# Patient Record
Sex: Female | Born: 1975 | Hispanic: No | Marital: Married | Smoking: Current some day smoker
Health system: Southern US, Community
[De-identification: ages and names within clinical notes are randomized; demographics above are authoritative.]

## PROBLEM LIST (undated history)

## (undated) DIAGNOSIS — M79672 Pain in left foot: Secondary | ICD-10-CM

## (undated) DIAGNOSIS — F419 Anxiety disorder, unspecified: Secondary | ICD-10-CM

## (undated) DIAGNOSIS — N289 Disorder of kidney and ureter, unspecified: Secondary | ICD-10-CM

## (undated) DIAGNOSIS — K8020 Calculus of gallbladder without cholecystitis without obstruction: Secondary | ICD-10-CM

## (undated) DIAGNOSIS — D649 Anemia, unspecified: Secondary | ICD-10-CM

## (undated) DIAGNOSIS — R002 Palpitations: Secondary | ICD-10-CM

## (undated) HISTORY — DX: Anemia, unspecified: D64.9

## (undated) HISTORY — DX: Pain in left foot: M79.672

## (undated) HISTORY — PX: ABLATION: SHX5711

## (undated) HISTORY — PX: TUBAL LIGATION: SHX77

## (undated) HISTORY — DX: Palpitations: R00.2

## (undated) HISTORY — DX: Calculus of gallbladder without cholecystitis without obstruction: K80.20

## (undated) HISTORY — PX: TONSILLECTOMY: SUR1361

---

## 2002-12-30 ENCOUNTER — Encounter: Payer: Self-pay | Admitting: Family Medicine

## 2002-12-30 ENCOUNTER — Ambulatory Visit (HOSPITAL_COMMUNITY): Admission: RE | Admit: 2002-12-30 | Discharge: 2002-12-30 | Payer: Self-pay | Admitting: Family Medicine

## 2003-02-03 ENCOUNTER — Other Ambulatory Visit: Admission: RE | Admit: 2003-02-03 | Discharge: 2003-02-03 | Payer: Self-pay | Admitting: Family Medicine

## 2003-03-07 ENCOUNTER — Encounter: Payer: Self-pay | Admitting: Family Medicine

## 2003-03-07 ENCOUNTER — Ambulatory Visit (HOSPITAL_COMMUNITY): Admission: RE | Admit: 2003-03-07 | Discharge: 2003-03-07 | Payer: Self-pay | Admitting: Family Medicine

## 2003-04-06 ENCOUNTER — Ambulatory Visit (HOSPITAL_COMMUNITY): Admission: RE | Admit: 2003-04-06 | Discharge: 2003-04-06 | Payer: Self-pay | Admitting: Family Medicine

## 2003-04-06 ENCOUNTER — Encounter: Payer: Self-pay | Admitting: Family Medicine

## 2003-07-19 ENCOUNTER — Ambulatory Visit (HOSPITAL_COMMUNITY): Admission: RE | Admit: 2003-07-19 | Discharge: 2003-07-19 | Payer: Self-pay | Admitting: Family Medicine

## 2003-07-29 ENCOUNTER — Inpatient Hospital Stay (HOSPITAL_COMMUNITY): Admission: AD | Admit: 2003-07-29 | Discharge: 2003-07-29 | Payer: Self-pay | Admitting: Family Medicine

## 2003-08-14 ENCOUNTER — Inpatient Hospital Stay (HOSPITAL_COMMUNITY): Admission: AD | Admit: 2003-08-14 | Discharge: 2003-08-14 | Payer: Self-pay | Admitting: Family Medicine

## 2003-08-15 ENCOUNTER — Inpatient Hospital Stay (HOSPITAL_COMMUNITY): Admission: AD | Admit: 2003-08-15 | Discharge: 2003-08-17 | Payer: Self-pay | Admitting: Family Medicine

## 2004-12-17 ENCOUNTER — Ambulatory Visit: Payer: Self-pay | Admitting: Family Medicine

## 2004-12-25 ENCOUNTER — Ambulatory Visit: Payer: Self-pay | Admitting: Family Medicine

## 2005-10-23 ENCOUNTER — Ambulatory Visit: Payer: Self-pay | Admitting: Family Medicine

## 2005-12-12 ENCOUNTER — Ambulatory Visit: Payer: Self-pay | Admitting: Family Medicine

## 2005-12-16 ENCOUNTER — Ambulatory Visit: Payer: Self-pay | Admitting: Family Medicine

## 2006-01-07 ENCOUNTER — Ambulatory Visit: Payer: Self-pay | Admitting: Family Medicine

## 2006-01-22 ENCOUNTER — Emergency Department (HOSPITAL_COMMUNITY): Admission: EM | Admit: 2006-01-22 | Discharge: 2006-01-23 | Payer: Self-pay | Admitting: Emergency Medicine

## 2006-01-28 ENCOUNTER — Ambulatory Visit: Payer: Self-pay | Admitting: Family Medicine

## 2006-02-06 ENCOUNTER — Ambulatory Visit: Payer: Self-pay | Admitting: Family Medicine

## 2006-02-18 ENCOUNTER — Ambulatory Visit: Payer: Self-pay | Admitting: Cardiology

## 2006-02-21 ENCOUNTER — Ambulatory Visit: Payer: Self-pay | Admitting: Cardiology

## 2006-02-28 ENCOUNTER — Ambulatory Visit: Payer: Self-pay | Admitting: Cardiology

## 2006-05-27 ENCOUNTER — Ambulatory Visit: Payer: Self-pay | Admitting: Family Medicine

## 2006-06-25 ENCOUNTER — Ambulatory Visit: Payer: Self-pay | Admitting: Family Medicine

## 2006-10-03 ENCOUNTER — Inpatient Hospital Stay (HOSPITAL_COMMUNITY): Admission: AD | Admit: 2006-10-03 | Discharge: 2006-10-03 | Payer: Self-pay | Admitting: Obstetrics and Gynecology

## 2006-12-29 ENCOUNTER — Inpatient Hospital Stay (HOSPITAL_COMMUNITY): Admission: AD | Admit: 2006-12-29 | Discharge: 2006-12-29 | Payer: Self-pay | Admitting: Obstetrics and Gynecology

## 2007-02-05 ENCOUNTER — Inpatient Hospital Stay (HOSPITAL_COMMUNITY): Admission: AD | Admit: 2007-02-05 | Discharge: 2007-02-05 | Payer: Self-pay | Admitting: Obstetrics and Gynecology

## 2007-03-24 ENCOUNTER — Ambulatory Visit (HOSPITAL_COMMUNITY): Admission: RE | Admit: 2007-03-24 | Discharge: 2007-03-24 | Payer: Self-pay | Admitting: Obstetrics and Gynecology

## 2007-05-07 ENCOUNTER — Inpatient Hospital Stay (HOSPITAL_COMMUNITY): Admission: AD | Admit: 2007-05-07 | Discharge: 2007-05-11 | Payer: Self-pay | Admitting: Obstetrics and Gynecology

## 2007-05-08 ENCOUNTER — Encounter (INDEPENDENT_AMBULATORY_CARE_PROVIDER_SITE_OTHER): Payer: Self-pay | Admitting: Obstetrics and Gynecology

## 2007-07-10 ENCOUNTER — Ambulatory Visit (HOSPITAL_COMMUNITY): Admission: RE | Admit: 2007-07-10 | Discharge: 2007-07-10 | Payer: Self-pay | Admitting: Obstetrics and Gynecology

## 2010-10-30 NOTE — H&P (Signed)
NAMEMarland Schultz  Sheila, Schultz NO.:  [id number]   MEDICAL RECORD NO.:  [id number]          PATIENT TYPE:  INP   LOCATION:  9168                          FACILITY:  [facility]   PHYSICIAN:  Juluis Mire, M.D.   DATE OF BIRTH:  [birth year]   DATE OF ADMISSION:  05/07/2007  DATE OF DISCHARGE:                              HISTORY & PHYSICAL   The patient is a 35 year old, gravida 4, para 1, abortus 2, female with  last menstrual period of August 27, 2006 giving her estimated date of  confinement June 03, 2007 and gives her an estimated gestational age  of [redacted] weeks and 1 day.  She is admitted for Cytotec ripening of the  cervix and induction of labor.   RELATION TO PRESENT ADMISSION:  The patient's first trimester screening  indicated an increased risk of Down syndrome.  She was seen at [facility] where an ultrasound was basically normal.  Subsequent  amniocentesis revealed a genetically normal infant.  They have been  following her with serial ultrasounds due to some renal pyelectasis.  On  a relatively recent ultrasound, they noticed an SGA infant with  decreased amniotic fluid.  Yesterday, we did an ultrasound which  revealed the estimated fetal weight in the 6th percentile and amniotic  fluid was 33 percentile.  We did an amniocentesis.  The LS was 2.7:1  with PG.  Because of this, the patient was brought in at the present  time to undergo Cytotec ripening of the cervix and induction of labor.  She does have positive group B strep in the urine and will require  antibiotics.   ALLERGIES:  No known drug allergies.   MEDICATIONS:  Prenatal vitamins.   PAST MEDICAL HISTORY:  Please see prenatal records.   FAMILY HISTORY:  Please see prenatal records.   SOCIAL HISTORY:  Please see prenatal records.   PHYSICAL EXAMINATION:  VITAL SIGNS:  The patient is afebrile with stable  vital signs.  HEENT:  The patient is normocephalic.  Pupils are equal, round and  reactive to light and accommodation.  Extraocular movements are intact.  Sclerae and conjunctivae are clear.  Oropharynx is clear.  NECK:  Without thyromegaly.  BREASTS:  Not examined.  LUNGS:  Clear.  CARDIAC SYSTEM:  Regular rhythm and rate with a grade 2/6 systolic  ejection murmur.  No clicks or gallops.  PELVIC EXAM:  Gravid uterus consistent with dates.  Cervix is fingertip  and long, vertex presenting.  EXTREMITIES:  Trace edema.  NEUROLOGIC:  Grossly within normal limits.  Deep tendon reflexes are 2+.  No clonus.   IMPRESSION:  1. Intrauterine pregnancy at 36 weeks with small for gestational age      and mature fetal pulmonary studies.  2. Positive group B streptococcus with urine.   PLAN:  The patient will undergo Cytotec ripening of the cervix and  induction of labor.  The risks have been discussed including the risk of  the Cytotec leading to excessive uterine activity that could require  terbutaline or cesarean section, induction of labor can lead to fetal  distress  also requiring cesarean section.  The patient expressed  understanding of indications and risks.      Juluis Mire, M.D.  Electronically Signed     JSM/MEDQ  D:  05/07/2007  T:  05/08/2007  Job:  [id number]

## 2010-10-30 NOTE — Op Note (Signed)
NAME:  Sheila Schultz, Sheila Schultz             ACCOUNT NO.:  [id number]   MEDICAL RECORD NO.:  [id number]          PATIENT TYPE:  AMB   LOCATION:  SDC                           [facility]ACILITY:  [facility]   PHYSICIAN:  Juluis Mire, M.D.   DATE OF BIRTH:  1975/10/18   DATE OF PROCEDURE:  07/10/2007  DATE OF DISCHARGE:                               OPERATIVE REPORT   ADMISSION DIAGNOSIS:  Multiparity, desires sterility.   POSTOPERATIVE DIAGNOSIS:  Multiparity, desires sterility.   PROCEDURE:  Open laparoscopy with bilateral tubal fulguration.   SURGEON:  Juluis Mire, M.D.   ANESTHESIA:  General.   ESTIMATED BLOOD LOSS:  Minimal.   PACKS AND DRAINS:  None.   INTRAOPERATIVE BLOOD REPLACED:  None.   COMPLICATIONS:  None.   INDICATIONS:  Dictated in history and physical.   DESCRIPTION OF PROCEDURE:  The patient was taken to the OR and placed in  supine position.  After a satisfactory level of general endotracheal  anesthesia was obtained, the patient was placed in the dorsal lithotomy  position using the Allen stirrups.  At this point in time, the bladder  was in-and-out catheterized.  The abdomen, perineum, and vagina were  prepped out Betadine, and a Hulka tenaculum was put in place and  secured.  The patient was then draped as a sterile field.   Subumbilical incision made with a knife and carried through the  subcutaneous tissue.  The fascia was entered sharply and the incision in  the fascia extended laterally.  The  muscles were separated, and the  peritoneum was entered sharply.  An open laparoscopic trocar was put in  place and secured.  The laparoscope was introduced.  There was no  evidence of injury to adjacent organs.  The appendix was visualized and  noted to be normal.  The upper abdomen, including the liver and tip of  the gallbladder were clear.  The uterus was of normal size and shape.  The tubes and ovaries were unremarkable.  Using the bipolar, a  midsegment of each tube was  cauterized for a distance of 2.5 cm.  Coagulation was continued until resistance read 0.  The same segment of  tube was then recoagulated, completely desiccating the tube.  Coagulation did extend out to the mesosalpinx.  Adequate coagulation was  noted on each side.  Again, the ovaries were unremarkable.  At this  point in time, the abdomen was deflated of its carbon dioxide.  The  laparoscope and trocars were removed.  Subumbilical fascia closed with a  figure-of-eight of 0 Vicryl.  The skin was closed with interrupted  subcuticulars of 4-0 Vicryl.  The Hulka tenaculum was then removed.   The patient was taken out of dorsal position and once alert and  extubated was transferred to the recovery room in good condition.  Sponge, instrument, and needle count reported as correct by circulating  nurse x2.      Juluis Mire, M.D.  Electronically Signed     JSM/MEDQ  D:  07/10/2007  T:  07/10/2007  Job:  [id number]

## 2010-10-30 NOTE — H&P (Signed)
NAME:  Sheila Schultz, Sheila Schultz NO.:  [id number]   MEDICAL RECORD NO.:  [id number]          PATIENT TYPE:  AMB   LOCATION:  SDC                           [facility]   PHYSICIAN:  Juluis Mire, M.D.   DATE OF BIRTH:  07-26-1975   DATE OF ADMISSION:  DATE OF DISCHARGE:                              HISTORY & PHYSICAL   The patient is a 35 year old, gravida 4, para 2, abortus 2 female who  had a vaginal delivery on November 21.  Desires a permanent  sterilization, presents for laparoscopic bilateral tubal ligation.  Alternative forms of birth control have been discussed.  The potential  irreversibility of sterilization was explained.  Failure rate of 1-200  was quoted, and failures can be in the form of ectopic pregnancy  requiring further surgical management.   In terms of allergies, no known drug allergies.   MEDICATIONS:  Include vitamins.   PAST MEDICAL HISTORY:  Usual childhood diseases.  No significant  sequelae.   PAST SURGICAL HISTORY:  She has had a previous tonsillectomy.  She had  laparoscopy for endometriosis in 2000.   OBSTETRICAL HISTORY:  Two vaginal deliveries and two miscarriages.   FAMILY HISTORY:  Noncontributory.   SOCIAL HISTORY:  Reveals no tobacco or alcohol use.   REVIEW OF SYSTEMS:  Is noncontributory.   PHYSICAL EXAMINATION:  Patient is afebrile with stable vital signs.  HEENT:  The patient is normocephalic.  Pupils equal, round and reactive  to light and accommodation.  Extraocular movements were intact.  Sclerae  and conjunctivae were clear.  Oropharynx clear.  NECK:  Without thyromegaly.  BREASTS:  Not examined.  LUNGS:  Clear.  CARDIOVASCULAR SYSTEM:  Regular rate.  No murmurs or gallops.  ABDOMINAL EXAM:  Is benign.  No mass, organomegaly or tenderness.  PELVIC:  Normal external genitalia.  Vaginal mucosa clear.  Cervix  unremarkable.  Uterus normal size, shape and contour.  Adnexa free of  masses or tenderness.  EXTREMITIES:   Trace edema.  NEUROLOGIC EXAM:  Grossly within normal limits.   IMPRESSION:  Multiparity, desires sterility.   PLAN:  The patient to undergo laparoscopic bilateral tubal fulguration.  The risks of surgery have been discussed including the risk of  infection.  Risk of hemorrhage that could require transfusion with the  risk of AIDS or hepatitis.  Risk of injury to adjacent organs including bladder, bowel, ureters that  could require further exploratory surgery.  Risk of deep venous  thrombosis and pulmonary embolus.  The patient expressed understanding  of indications and risks.      Juluis Mire, M.D.  Electronically Signed     JSM/MEDQ  D:  07/10/2007  T:  07/10/2007  Job:  [id number]

## 2010-11-02 NOTE — Assessment & Plan Note (Signed)
[facility]                            [facility] OFFICE NOTE   Sheila, Schultz                    MRN:          [id number]  DATE:02/28/2006                            DOB:          [birth year]    Sheila Schultz is referred for the evaluation of a rapid heartbeat and  syncope.  She is having recurrent episodes of suddenly feeling/sensing a  darkness or passing out.  She does not have seizure activity.  She has not  been injured.  These episodes last for 30 to 40 seconds.  It is of note that  the patient has lost 50 pounds in the last four months, according to her.  She was on a low carb diet for a week or two, and she then stopped this.  However, the weight loss has continued.  The etiology is not clear to me.  She tells me that she did have labs checked by Dr. Lysbeth Galas that included a  normal TSH.  She does have a history of severe anemia, and tells me now that  her hemoglobin is probably as low as 6 at times, and she is on iron.   The patient had a Holter monitor placed here on February 18, 2006.  She had  some episodes of sinus tachycardia.  The mean heart rate was 83.  Rates  ranged from as low as 43 to as high as 158.  Also, she had a 2D echo on  February 21, 2006, showing an ejection fraction of 50% to 60% with no  significant abnormalities.   PAST MEDICAL HISTORY:   ALLERGIES:  No known drug allergies.   MEDICATIONS:  Klonopin 1 mg.   OTHER MEDICAL PROBLEMS:  See the list below.   SOCIAL HISTORY:  The patient is married with one child of 2 years.  She does  smoke 5 cigarettes daily.   FAMILY HISTORY:  There is a family history of coronary disease.   REVIEW OF SYSTEMS:  At this time, other than her syncope, she is not having  any significant problems.   PHYSICAL EXAMINATION:  Blood pressure lying is 114/62, and her resting pulse  is 96.  Sitting up, she feels a little dizzy, and her blood pressure was  116.60 with a pulse of  100.  Standing pressure remained in the 106/66 range,  and her heart rate went as high as 130.  LUNGS:  Clear.  Respiratory effort is not labored.  HEENT:  Reveals no xanthelasma.  There is normal extraocular motion.  There  are no carotid bruits.  There is no jugular venous distention.  The patient  had a very small pierced object of jewelry in her left nostril.  CARDIAC:  Exam revealed an S1 with an S2. There are no clicks or significant  murmurs.  ABDOMEN:  Soft.  There are no masses or bruits.  She has no significant peripheral edema.   EKG reveals inverted T waves in leads V1 to V3.  A 2D echo, as mentioned,  revealed normal LV function with an ejection fraction of 50% to 60%.  Holter  monitor showed sinus tachycardia.   PROBLEM LIST:  1. History of some anxiety, for which she takes Klonopin.  2. History of significant anemia.  I do not have recent labs, but she      tells me that her hemoglobin recently was in the range of 6.  I now      realize that she says she takes iron, but it is not listed on our med      list.  3. Unexplained 50-pound weight loss.  4. Resting sinus tachycardia.  5. Normal left ventricular function by echo.  6. Sinus tachycardia on Holter.  7. Episodes of brief syncope.   At this point the patient appears to not have any significant structural  heart disease.  I have considered giving her a beta blocker for resting  sinus tachycardia.  This, of course, is assuming that her TSH really is  normal.  However, with her history of anemia I am very hesitant to add a  beta blocker.  I have recommended no further cardiac workup.  I will be  happy to see the patient back for further evaluation if Dr. Lysbeth Galas feels it  is appropriate.  Hopefully, stabilization of her hemoglobin will help.  I  will have to leave the workup of her weight loss to Dr. Lysbeth Galas.                                   Luis Abed, MD, [facility]   JDK/MedQ  DD:  02/28/2006  DT:   03/02/2006  Job #:  [id number]   cc:   Delaney Meigs, M.D.

## 2011-03-07 LAB — CBC
HCT: 41.4
Hemoglobin: 14.1
MCHC: 34
MCV: 88.8
Platelets: 288
RBC: 4.66
RDW: 14.4
WBC: 8.7

## 2011-03-26 LAB — COMPREHENSIVE METABOLIC PANEL
BUN: 5 — ABNORMAL LOW
CO2: 21
Chloride: 105
Creatinine, Ser: 0.58
GFR calc non Af Amer: >60
Glucose, Bld: 100 — ABNORMAL HIGH
Total Bilirubin: 0.6

## 2011-03-26 LAB — CBC
HCT: 32.6 — ABNORMAL LOW
Hemoglobin: 11.3 — ABNORMAL LOW
MCHC: 34.8
MCV: 90.1
Platelets: 173
RDW: 13.5
WBC: 8.8

## 2011-03-26 LAB — URIC ACID: Uric Acid, Serum: 5.8

## 2011-03-26 LAB — LACTATE DEHYDROGENASE: LDH: 107

## 2011-03-26 LAB — RH IMMUNE GLOB WKUP(>/=20WKS)(NOT WOMEN'S HOSP)

## 2011-03-28 LAB — RH IMMUNE GLOBULIN WORKUP (NOT WOMEN'S HOSP): ABO/RH(D): O NEG

## 2011-03-29 LAB — COMPREHENSIVE METABOLIC PANEL
BUN: 5 — ABNORMAL LOW
CO2: 25
Chloride: 106
Creatinine, Ser: 0.49
GFR calc non Af Amer: >60
Glucose, Bld: 89
Total Bilirubin: 0.4

## 2011-03-29 LAB — URINALYSIS, ROUTINE W REFLEX MICROSCOPIC
Bilirubin Urine: NEGATIVE
Hgb urine dipstick: NEGATIVE
Protein, ur: NEGATIVE
Urobilinogen, UA: 0.2

## 2011-03-29 LAB — URINE MICROSCOPIC-ADD ON

## 2011-03-29 LAB — CBC
HCT: 32.2 — ABNORMAL LOW
Hemoglobin: 11.3 — ABNORMAL LOW
MCV: 90.6
RBC: 3.55 — ABNORMAL LOW
WBC: 9.7

## 2011-03-29 LAB — URIC ACID: Uric Acid, Serum: 3.9

## 2011-04-02 LAB — CBC
HCT: 32.7 — ABNORMAL LOW
Hemoglobin: 11.2 — ABNORMAL LOW
MCV: 90.3
Platelets: 228
RDW: 12.6
WBC: 9.3

## 2011-04-02 LAB — URINALYSIS, ROUTINE W REFLEX MICROSCOPIC
Ketones, ur: NEGATIVE
Nitrite: NEGATIVE
Protein, ur: NEGATIVE

## 2011-04-02 LAB — URINE CULTURE: Colony Count: 35000

## 2012-05-21 ENCOUNTER — Emergency Department (HOSPITAL_COMMUNITY)
Admission: EM | Admit: 2012-05-21 | Discharge: 2012-05-21 | Disposition: A | Discharge status: Home / Self Care | Payer: Medicaid Other | Attending: Emergency Medicine | Admitting: Emergency Medicine

## 2012-05-21 ENCOUNTER — Encounter (HOSPITAL_COMMUNITY): Payer: Self-pay | Admitting: *Deleted

## 2012-05-21 ENCOUNTER — Emergency Department (HOSPITAL_COMMUNITY): Payer: Medicaid Other

## 2012-05-21 DIAGNOSIS — R1031 Right lower quadrant pain: Secondary | ICD-10-CM | POA: Insufficient documentation

## 2012-05-21 DIAGNOSIS — R110 Nausea: Secondary | ICD-10-CM | POA: Insufficient documentation

## 2012-05-21 DIAGNOSIS — Z87442 Personal history of urinary calculi: Secondary | ICD-10-CM | POA: Insufficient documentation

## 2012-05-21 DIAGNOSIS — R109 Unspecified abdominal pain: Secondary | ICD-10-CM

## 2012-05-21 DIAGNOSIS — F17200 Nicotine dependence, unspecified, uncomplicated: Secondary | ICD-10-CM | POA: Insufficient documentation

## 2012-05-21 DIAGNOSIS — R509 Fever, unspecified: Secondary | ICD-10-CM | POA: Insufficient documentation

## 2012-05-21 DIAGNOSIS — Z87448 Personal history of other diseases of urinary system: Secondary | ICD-10-CM | POA: Insufficient documentation

## 2012-05-21 DIAGNOSIS — R630 Anorexia: Secondary | ICD-10-CM | POA: Insufficient documentation

## 2012-05-21 DIAGNOSIS — Z3202 Encounter for pregnancy test, result negative: Secondary | ICD-10-CM | POA: Insufficient documentation

## 2012-05-21 DIAGNOSIS — Z79899 Other long term (current) drug therapy: Secondary | ICD-10-CM | POA: Insufficient documentation

## 2012-05-21 HISTORY — DX: Disorder of kidney and ureter, unspecified: N28.9

## 2012-05-21 LAB — CBC
HCT: 37.8 % (ref 36.0–46.0)
Hemoglobin: 12.6 g/dL (ref 12.0–15.0)
RBC: 4.11 MIL/uL (ref 3.87–5.11)
WBC: 5.4 10*3/uL (ref 4.0–10.5)

## 2012-05-21 LAB — COMPREHENSIVE METABOLIC PANEL
ALT: 12 U/L (ref 0–35)
Albumin: 3.9 g/dL (ref 3.5–5.2)
Alkaline Phosphatase: 56 U/L (ref 39–117)
BUN: 6 mg/dL (ref 6–23)
Chloride: 105 mEq/L (ref 96–112)
GFR calc Af Amer: >90 mL/min (ref >90)
Glucose, Bld: 88 mg/dL (ref 70–99)
Potassium: 4.1 mEq/L (ref 3.5–5.1)
Sodium: 140 mEq/L (ref 135–145)
Total Bilirubin: 0.3 mg/dL (ref 0.3–1.2)
Total Protein: 7 g/dL (ref 6.0–8.3)

## 2012-05-21 LAB — URINALYSIS, ROUTINE W REFLEX MICROSCOPIC
Bilirubin Urine: NEGATIVE
Glucose, UA: NEGATIVE mg/dL
Specific Gravity, Urine: <1.005 — ABNORMAL LOW (ref 1.005–1.030)
Urobilinogen, UA: 0.2 mg/dL (ref 0.0–1.0)

## 2012-05-21 LAB — URINE MICROSCOPIC-ADD ON

## 2012-05-21 LAB — LIPASE, BLOOD: Lipase: 24 U/L (ref 11–59)

## 2012-05-21 MED ORDER — KETOROLAC TROMETHAMINE 30 MG/ML IJ SOLN
30.0000 mg | Freq: Once | INTRAMUSCULAR | Status: AC
  Administered 2012-05-21: 30 mg via INTRAVENOUS
  Filled 2012-05-21: qty 1

## 2012-05-21 MED ORDER — IOHEXOL 300 MG/ML  SOLN
100.0000 mL | Freq: Once | INTRAMUSCULAR | Status: AC | PRN
  Administered 2012-05-21: 100 mL via INTRAVENOUS

## 2012-05-21 MED ORDER — SODIUM CHLORIDE 0.9 % IV SOLN
1000.0000 mL | Freq: Once | INTRAVENOUS | Status: AC
  Administered 2012-05-21: 1000 mL via INTRAVENOUS

## 2012-05-21 MED ORDER — IOHEXOL 300 MG/ML  SOLN: 100.0000 mL | Freq: Once | INTRAMUSCULAR | Status: DC | PRN

## 2012-05-21 MED ORDER — SODIUM CHLORIDE 0.9 % IV SOLN
1000.0000 mL | INTRAVENOUS | Status: DC
  Administered 2012-05-21: 1000 mL via INTRAVENOUS

## 2012-05-21 MED ORDER — IBUPROFEN 600 MG PO TABS: 600.0000 mg | ORAL_TABLET | Freq: Three times a day (TID) | ORAL | Status: DC | PRN

## 2012-05-21 MED ORDER — HYDROCODONE-ACETAMINOPHEN 5-325 MG PO TABS: 1.0000 | ORAL_TABLET | ORAL | Status: DC | PRN

## 2012-05-21 MED ORDER — ONDANSETRON 8 MG PO TBDP: 8.0000 mg | ORAL_TABLET | Freq: Three times a day (TID) | ORAL | Status: DC | PRN

## 2012-05-21 NOTE — ED Provider Notes (Signed)
History  This chart was scribed for Sheila Co, MD by Ardeen Jourdain, ED Scribe. This patient was seen in room APA08/APA08 and the patient's care was started at 1107.  CSN: [id number]  Arrival date & time 05/21/12  1017   First MD Initiated Contact with Patient 05/21/12 1107      Chief Complaint  Patient presents with  . Abdominal Pain     The history is provided by the patient. No language interpreter was used.    Sheila Schultz is a 36 y.o. female who presents to the Emergency Department complaining of gradually worsening, constant mid abdominal pain with associated fever, abdominal pain, loss of appetite and nausea. She states the pain gradually started Monday and radiates down into her back. She denies diarrhea, pain with urination, burning with urination, vaginal bleeding, vaginal discharge and emesis as associated symptoms. She has a h/o gallbladder issues and kidney stones. She states the pain is different from her previous symptoms. She states the pain is aggravated with moving and walking. Pt is a current someday smoker but denies alcohol use.   Past Medical History  Diagnosis Date  . Renal disorder     Past Surgical History  Procedure Date  . Tonsillectomy   . Tubal ligation     No family history on file.  History  Substance Use Topics  . Smoking status: Current Some Day Smoker    Types: Cigarettes  . Smokeless tobacco: Not on file  . Alcohol Use: No   No OB history available.   Review of Systems  All other systems reviewed and are negative.  A complete 10 system review of systems was obtained and all systems are negative except as noted in the HPI and PMH.    Allergies  Morphine and related  Home Medications   Current Outpatient Rx  Name  Route  Sig  Dispense  Refill  . CLONAZEPAM 0.5 MG PO TABS   Oral   Take 0.5 mg by mouth daily.           Triage Vitals: BP 115/80  Pulse 90  Resp 20  Ht 5\' 6"  (1.676 m)  Wt 140 lb (63.504 kg)  BMI  22.60 kg/m2  SpO2 99%  LMP 05/12/2012  Physical Exam  Nursing note and vitals reviewed. Constitutional: She is oriented to person, place, and time. She appears well-developed and well-nourished. No distress.  HENT:  Head: Normocephalic and atraumatic.  Eyes: EOM are normal.  Neck: Normal range of motion.  Cardiovascular: Normal rate, regular rhythm and normal heart sounds.   Pulmonary/Chest: Effort normal and breath sounds normal.  Abdominal: Soft. Bowel sounds are normal. She exhibits no distension. There is tenderness. There is guarding. There is no rebound.       RLQ tenderness   Musculoskeletal: Normal range of motion.  Neurological: She is alert and oriented to person, place, and time.  Skin: Skin is warm and dry.  Psychiatric: She has a normal mood and affect. Judgment normal.    ED Course  Procedures (including critical care time)  DIAGNOSTIC STUDIES: Oxygen Saturation is 99% on room air, normal by my interpretation.    COORDINATION OF CARE:  11:10 AM: Discussed treatment plan which includes a CT of the  with pt at bedside and pt agreed to plan.    Labs Reviewed  URINALYSIS, ROUTINE W REFLEX MICROSCOPIC - Abnormal; Notable for the following:    Specific Gravity, Urine <1.005 (*)     Hgb urine dipstick MODERATE (*)  All other components within normal limits  URINE MICROSCOPIC-ADD ON - Abnormal; Notable for the following:    Squamous Epithelial / LPF FEW (*)     All other components within normal limits  CBC  COMPREHENSIVE METABOLIC PANEL  LIPASE, BLOOD  PREGNANCY, URINE   Ct Abdomen Pelvis W Contrast  05/21/2012  *RADIOLOGY REPORT*  Clinical Data: Right side abdominal pain, nausea, past history kidney stones  CT ABDOMEN AND PELVIS WITH CONTRAST  Technique:  Multidetector CT imaging of the abdomen and pelvis was performed following the standard protocol during bolus administration of intravenous contrast. Sagittal and coronal MPR images reconstructed from axial  data set.  Contrast: OMNIPAQUE IOHEXOL 300 MG/ML  SOLN Dilute oral contrast.  Comparison: None  Findings: Lung bases clear. Minimal focal fatty infiltration of liver adjacent to falciform fissure. Liver, spleen, pancreas, kidneys, and adrenal glands normal appearance. Normal appendix. Unremarkable uterus, adnexae, bladder, and ureters. Stomach and bowel loops normal appearance. Tiny umbilical hernia containing fat. No mass, adenopathy, free fluid or inflammatory process. No acute osseous findings.  IMPRESSION: No acute intra abdominal or intrapelvic abnormalities. Tiny umbilical hernia containing fat.   Original Report Authenticated By: Ulyses Southward, M.D.    US Abdomen Limited Ruq  05/21/2012  *RADIOLOGY REPORT*  Clinical Data:  Right-sided abdominal pain.  LIMITED ABDOMINAL ULTRASOUND - RIGHT UPPER QUADRANT  Comparison:  CT of the abdomen and pelvis 05/21/2012  Findings:  Gallbladder:  Gallbladder has a normal appearance.  Gallbladder wall is 1.8 mm, within normal limits.  No stones or pericholecystic fluid.  No sonographic Murphy's sign.  Common bile duct:   Normal in appearance, 4.0 mm.  Liver:  Homogeneous in echotexture.  No focal mass identified.  IMPRESSION: Normal exam.  No evidence for acute cholecystitis.                    Original Report Authenticated By: Norva Pavlov, M.D.    I personally reviewed the imaging tests through PACS system I reviewed available ER/hospitalization records through the EMR   1. Abdominal pain       MDM  At time of discharge the patient feels much better.  CT scan and ultrasound demonstrate no obvious acute cause of her right-sided abdominal pain.  Urine sample looks normal.  The patient felt much better.  Discharge home in good condition.  She understands to return to ER for new or worsening symptoms.  Home with a very short course of pain medicine.      I personally performed the services described in this documentation, which was scribed in my  presence. The recorded information has been reviewed and is accurate.      Sheila Co, MD 05/21/12 [phone]

## 2012-05-21 NOTE — ED Notes (Signed)
MD at bedside. Dr Patria Mane at bedside examining pt and discussing plan of care with said pt.

## 2012-05-21 NOTE — ED Notes (Signed)
Patient with no complaints at this time. Respirations even and unlabored. Skin warm/dry. Discharge instructions reviewed with patient at this time. Patient given opportunity to voice concerns/ask questions. IV removed per policy and band-aid applied to site. Patient discharged at this time and left Emergency Department with steady gait.  

## 2012-05-21 NOTE — ED Notes (Signed)
Pt presents with mid abdominal pain that radiates into back.

## 2012-07-23 ENCOUNTER — Other Ambulatory Visit: Payer: Self-pay | Admitting: Obstetrics and Gynecology

## 2013-07-01 ENCOUNTER — Encounter (HOSPITAL_COMMUNITY): Payer: Self-pay | Admitting: Emergency Medicine

## 2013-07-01 ENCOUNTER — Emergency Department (HOSPITAL_COMMUNITY)
Admission: EM | Admit: 2013-07-01 | Discharge: 2013-07-01 | Disposition: A | Discharge status: Home / Self Care | Payer: Medicaid Other | Attending: Emergency Medicine | Admitting: Emergency Medicine

## 2013-07-01 DIAGNOSIS — F411 Generalized anxiety disorder: Secondary | ICD-10-CM | POA: Insufficient documentation

## 2013-07-01 DIAGNOSIS — F17200 Nicotine dependence, unspecified, uncomplicated: Secondary | ICD-10-CM | POA: Insufficient documentation

## 2013-07-01 DIAGNOSIS — Z87448 Personal history of other diseases of urinary system: Secondary | ICD-10-CM | POA: Insufficient documentation

## 2013-07-01 DIAGNOSIS — Z79899 Other long term (current) drug therapy: Secondary | ICD-10-CM | POA: Insufficient documentation

## 2013-07-01 DIAGNOSIS — R1013 Epigastric pain: Secondary | ICD-10-CM | POA: Insufficient documentation

## 2013-07-01 DIAGNOSIS — R109 Unspecified abdominal pain: Secondary | ICD-10-CM

## 2013-07-01 HISTORY — DX: Anxiety disorder, unspecified: F41.9

## 2013-07-01 MED ORDER — PROMETHAZINE HCL 25 MG PO TABS: 25.0000 mg | ORAL_TABLET | Freq: Four times a day (QID) | ORAL | Status: DC | PRN

## 2013-07-01 MED ORDER — FENTANYL CITRATE 0.05 MG/ML IJ SOLN: 50.0000 ug | INTRAMUSCULAR | Status: DC | PRN

## 2013-07-01 MED ORDER — SODIUM CHLORIDE 0.9 % IV SOLN: INTRAVENOUS | Status: DC

## 2013-07-01 MED ORDER — ONDANSETRON HCL 4 MG/2ML IJ SOLN: 4.0000 mg | Freq: Once | INTRAMUSCULAR | Status: DC

## 2013-07-01 MED ORDER — IBUPROFEN 600 MG PO TABS: 600.0000 mg | ORAL_TABLET | Freq: Four times a day (QID) | ORAL | Status: DC | PRN

## 2013-07-01 NOTE — Discharge Instructions (Signed)
Abdominal Pain, Women °Abdominal (stomach, pelvic, or belly) pain can be caused by many things. It is important to tell your doctor: °· The location of the pain. °· Does it come and go or is it present all the time? °· Are there things that start the pain (eating certain foods, exercise)? °· Are there other symptoms associated with the pain (fever, nausea, vomiting, diarrhea)? °All of this is helpful to know when trying to find the cause of the pain. °CAUSES  °· Stomach: virus or bacteria infection, or ulcer. °· Intestine: appendicitis (inflamed appendix), regional ileitis (Crohn's disease), ulcerative colitis (inflamed colon), irritable bowel syndrome, diverticulitis (inflamed diverticulum of the colon), or cancer of the stomach or intestine. °· Gallbladder disease or stones in the gallbladder. °· Kidney disease, kidney stones, or infection. °· Pancreas infection or cancer. °· Fibromyalgia (pain disorder). °· Diseases of the female organs: °· Uterus: fibroid (non-cancerous) tumors or infection. °· Fallopian tubes: infection or tubal pregnancy. °· Ovary: cysts or tumors. °· Pelvic adhesions (scar tissue). °· Endometriosis (uterus lining tissue growing in the pelvis and on the pelvic organs). °· Pelvic congestion syndrome (female organs filling up with blood just before the menstrual period). °· Pain with the menstrual period. °· Pain with ovulation (producing an egg). °· Pain with an IUD (intrauterine device, birth control) in the uterus. °· Cancer of the female organs. °· Functional pain (pain not caused by a disease, may improve without treatment). °· Psychological pain. °· Depression. °DIAGNOSIS  °Your doctor will decide the seriousness of your pain by doing an examination. °· Blood tests. °· X-rays. °· Ultrasound. °· CT scan (computed tomography, special type of X-ray). °· MRI (magnetic resonance imaging). °· Cultures, for infection. °· Barium enema (dye inserted in the large intestine, to better view it with  X-rays). °· Colonoscopy (looking in intestine with a lighted tube). °· Laparoscopy (minor surgery, looking in abdomen with a lighted tube). °· Major abdominal exploratory surgery (looking in abdomen with a large incision). °TREATMENT  °The treatment will depend on the cause of the pain.  °· Many cases can be observed and treated at home. °· Over-the-counter medicines recommended by your caregiver. °· Prescription medicine. °· Antibiotics, for infection. °· Birth control pills, for painful periods or for ovulation pain. °· Hormone treatment, for endometriosis. °· Nerve blocking injections. °· Physical therapy. °· Antidepressants. °· Counseling with a psychologist or psychiatrist. °· Minor or major surgery. °HOME CARE INSTRUCTIONS  °· Do not take laxatives, unless directed by your caregiver. °· Take over-the-counter pain medicine only if ordered by your caregiver. Do not take aspirin because it can cause an upset stomach or bleeding. °· Try a clear liquid diet (broth or water) as ordered by your caregiver. Slowly move to a bland diet, as tolerated, if the pain is related to the stomach or intestine. °· Have a thermometer and take your temperature several times a day, and record it. °· Bed rest and sleep, if it helps the pain. °· Avoid sexual intercourse, if it causes pain. °· Avoid stressful situations. °· Keep your follow-up appointments and tests, as your caregiver orders. °· If the pain does not go away with medicine or surgery, you may try: °· Acupuncture. °· Relaxation exercises (yoga, meditation). °· Group therapy. °· Counseling. °SEEK MEDICAL CARE IF:  °· You notice certain foods cause stomach pain. °· Your home care treatment is not helping your pain. °· You need stronger pain medicine. °· You want your IUD removed. °· You feel faint or   lightheaded. °· You develop nausea and vomiting. °· You develop a rash. °· You are having side effects or an allergy to your medicine. °SEEK IMMEDIATE MEDICAL CARE IF:  °· Your  pain does not go away or gets worse. °· You have a fever. °· Your pain is felt only in portions of the abdomen. The right side could possibly be appendicitis. The left lower portion of the abdomen could be colitis or diverticulitis. °· You are passing blood in your stools (bright red or black tarry stools, with or without vomiting). °· You have blood in your urine. °· You develop chills, with or without a fever. °· You pass out. °MAKE SURE YOU:  °· Understand these instructions. °· Will watch your condition. °· Will get help right away if you are not doing well or get worse. °Document Released: 03/31/2007 Document Revised: 08/26/2011 Document Reviewed: 04/20/2009 °[employer]® Patient Information ©2014 [employer]. ° °

## 2013-07-01 NOTE — ED Provider Notes (Signed)
CSN: [id number]     Arrival date & time 07/01/13  0539 History   First MD Initiated Contact with Patient 07/01/13 0600     Chief Complaint  Patient presents with  . Abdominal Pain   (Consider location/radiation/quality/duration/timing/severity/associated sxs/prior Treatment) HPI History provided by patient. Around 4 AM woke with severe upper abdominal pain epigastric. Patient states it was cramping" twisting" in nature. Pain unrelenting so EMS was called. Patient states in route pain completely resolved. She did not receive any medications. Pain did not radiate to her back. She had nausea but no vomiting. No diarrhea. She denies any reflux or heartburn symptoms. No history of same. She was told that she had gallbladder problems about 2 years ago but never followed up or saw a Development worker, international aidgeneral surgeon. She remains completely pain-free in the ER.   Past Medical History  Diagnosis Date  . Renal disorder   . Anxiety    Past Surgical History  Procedure Laterality Date  . Tonsillectomy    . Tubal ligation    . Ablation     History reviewed. No pertinent family history. History  Substance Use Topics  . Smoking status: Current Some Day Smoker    Types: Cigarettes  . Smokeless tobacco: Not on file  . Alcohol Use: No   OB History   Grav Para Term Preterm Abortions TAB SAB Ect Mult Living                 Review of Systems  Constitutional: Negative for fever and chills.  Respiratory: Negative for shortness of breath.   Cardiovascular: Negative for chest pain.  Gastrointestinal: Positive for abdominal pain.  Genitourinary: Negative for dysuria.  Musculoskeletal: Negative for back pain, neck pain and neck stiffness.  Skin: Negative for rash.  Neurological: Negative for headaches.  All other systems reviewed and are negative.    Allergies  Morphine and related  Home Medications   Current Outpatient Rx  Name  Route  Sig  Dispense  Refill  . clonazePAM (KLONOPIN) 0.5 MG tablet   Oral  Take 0.5 mg by mouth daily.         Marland Kitchen. HYDROcodone-acetaminophen (NORCO/VICODIN) 5-325 MG per tablet   Oral   Take 1 tablet by mouth every 4 (four) hours as needed for pain.   15 tablet   0   . ibuprofen (ADVIL,MOTRIN) 600 MG tablet   Oral   Take 1 tablet (600 mg total) by mouth every 8 (eight) hours as needed for pain.   15 tablet   0   . ondansetron (ZOFRAN ODT) 8 MG disintegrating tablet   Oral   Take 1 tablet (8 mg total) by mouth every 8 (eight) hours as needed for nausea.   12 tablet   0    BP 106/63  Pulse 71  Temp(Src) 97.7 F (36.5 C) (Oral)  Resp 16  Ht 5\' 6"  (1.676 m)  Wt 145 lb (65.772 kg)  BMI 23.41 kg/m2  SpO2 100% Physical Exam  Constitutional: She is oriented to person, place, and time. She appears well-developed and well-nourished.  HENT:  Head: Normocephalic and atraumatic.  Mouth/Throat: Oropharynx is clear and moist.  Eyes: EOM are normal. Pupils are equal, round, and reactive to light. No scleral icterus.  Neck: Neck supple.  Cardiovascular: Normal rate, regular rhythm and intact distal pulses.   Pulmonary/Chest: Effort normal and breath sounds normal. No respiratory distress.  Abdominal: Soft. Bowel sounds are normal. She exhibits no distension and no mass. There is no  tenderness. There is no rebound and no guarding.  No CVA tenderness  Musculoskeletal: Normal range of motion. She exhibits no edema.  Neurological: She is alert and oriented to person, place, and time.  Skin: Skin is warm and dry.    ED Course  Procedures (including critical care time) Labs Review Labs Reviewed - No data to display Imaging Review No results found.  Patient elects to not have any blood work or imaging done at this time. Declines any medications.  6:39 AM she's tolerating by mouth fluids and on repeat exam her abdomen remained soft, nontender and nondistended.  Plan discharge home with prescription for Motrin and Phenergan as needed. General surgery referral  provided and patient agrees to call to arrange for followup in the office. She is stable for discharge home at this time.  MDM  Diagnosis: Abdominal pain  Serial evaluations with benign abdomen Vital signs and previous records reviewed. Nursing notes reviewed and considered.    Sunnie Nielsen, MD 07/01/13 [phone]

## 2013-07-01 NOTE — ED Notes (Signed)
Patient reports had sudden onset of sharp, excruciating abdominal pain that radiated across upper abdomen. Reports pain went away on the way here. Denies any pain, nausea, or complaints at this time.

## 2016-08-15 ENCOUNTER — Encounter: Payer: Self-pay | Admitting: Family Medicine

## 2016-08-15 ENCOUNTER — Encounter (INDEPENDENT_AMBULATORY_CARE_PROVIDER_SITE_OTHER): Payer: Self-pay

## 2016-08-15 ENCOUNTER — Ambulatory Visit (INDEPENDENT_AMBULATORY_CARE_PROVIDER_SITE_OTHER): Payer: Commercial Managed Care - PPO | Admitting: Family Medicine

## 2016-08-15 VITALS — BP 114/78 | HR 103 | Temp 97.9°F | Ht 66.0 in | Wt 192.6 lb

## 2016-08-15 DIAGNOSIS — F411 Generalized anxiety disorder: Secondary | ICD-10-CM

## 2016-08-15 DIAGNOSIS — R195 Other fecal abnormalities: Secondary | ICD-10-CM

## 2016-08-15 DIAGNOSIS — E669 Obesity, unspecified: Secondary | ICD-10-CM | POA: Diagnosis not present

## 2016-08-15 MED ORDER — MIRTAZAPINE 15 MG PO TABS: 15.0000 mg | ORAL_TABLET | Freq: Every day | ORAL | 2 refills | Status: DC

## 2016-08-15 NOTE — Patient Instructions (Signed)
Great to meet you!  Com ebcak within 1 week for fasting labs.   Start remeron 1 pill once a night  Cut back to 1/2 tab of klonopin twice daily  Follow up for anxiety in 1 to 2 weeks  Your provider wants you to schedule an appointment with a Psychologist/Psychiatrist. The following list of offices requires the patient to call and make their own appointment, as there is information they need that only you can provide. Please feel free to choose form the following providers:  [facility]   [phone] Crisis Recovery [place] [phone]  [facility]  [phone]   [address] [place], [place]  (Scheduled through [facility]) Must call and do an interview for appointment. Sees Children / Accepts Medicaid  [facility]    [phone]  [address], [address]    [place], [place]       [facility]  [phone] [address] [place], [place]  Evaluates for Autism but does not treat it Sees Children / Accepts Medicaid  [facility]    [phone] [address], [address]   [place], [place] Medication management, substance abuse, bipolar, grief, family, marriage, OCD, anxiety, PTSD Sees children / Accepts Medicaid  [facility]    [phone] [address] [place] Sees children / Accepts [facility]  [facility]  [phone] [address] [place], [place]   Dr Estelle GrumblesAkinlayo     [phone] [address] [place], [place]  Sees ADD & ADHD for treatment Accepts Medicaid  [facility]  [phone] [place], [place] Evaluates for Autism [facility]s [facility]  [facility]  [phone] [address] [place], [place]  Does Adult ADD evaluations Does not accept Medicaid  [facility]   [phone] [address]   [place], [place] Uses animal therapy  Sees children as young  as 40 years old [facility]s [facility]  [facility]     [phone]    [address]  [place], [place] [phone] Sees children Accepts Medicaid

## 2016-08-15 NOTE — Progress Notes (Addendum)
   HPI  Patient presents today here to establish care and discuss anxiety.  Patient states that she initially began treatment for anxiety with psychiatry as a teenager. She's been on Klonopin for years. She has tried several SSRIs including Zoloft, Prozac, Celexa, and Lexapro which causes "foggy headedness" She's also tried Effexor which caused similar issues. Remeron cause sedation but did not cause any other side effects.  Recently she's had severe issues with anxiety, she has difficulty sleeping. She denies suicidal thoughts.  She has also difficulty with frequent diarrhea. She has a very difficult time dieting due to issues with diarrhea as soon as she starts the diet. This seems to be worse with anxiety, also worse with high fiber foods.  PMH: Anxiety, renal disorder Surgical history positive for endometrial ablation, tonsillectomy, tubal ligation Family history positive for diabetes and [place] 5 grandmother, heart disease and hyperlipidemia as well as mother Denies alcohol and drug use-social history ROS: Per HPI  Objective: BP 114/78   Pulse (!) 103   Temp 97.9 F (36.6 C) (Oral)   Ht 5\' 6"  (1.676 m)   Wt 192 lb 9.6 oz (87.4 kg)   BMI 31.09 kg/m  Gen: NAD, alert, cooperative with exam HEENT: NCAT, perrla, oropharynx clear, Tms WNl, nares clear CV: RRR, good S1/S2, no murmur Resp: CTABL, no wheezes, non-labored Abd: SNTND, BS present, no guarding or organomegaly Ext: No edema, warm Neuro: Alert and oriented, No gross deficits, 2+ patellar tendon reflexes  Depression screen PHQ 2/9 08/15/2016  Decreased Interest 0  Down, Depressed, Hopeless 0  PHQ - 2 Score 0   GAD 7 : Generalized Anxiety Score 08/15/2016  Nervous, Anxious, on Edge 2  Control/stop worrying 3  Worry too much - different things 3  Trouble relaxing 3  Restless 3  Easily annoyed or irritable 3  Afraid - awful might happen 2  Total GAD 7 Score 19  Anxiety Difficulty Not difficult at all   Psych:    Denies SI Appropriate mood and affect  Assessment and plan:  # Generalized anxiety disorder Severe, uncontrolled with 2 mg of Klonopin daily Patient has tried several SSRIs, also SNRI with no improvement. I explained that given her long-term treatment with only benzodiazepines I am recommending she sees a psychiatrist. Referral written Start Remeron tonight, cut Klonopin in half, did not refill as this is her first visit, may follow-up in 1-2 weeks for one half dose from where she is currently at.  # Obesity Discussed therapeutic lifestyle changes Labs  # Loose stools Likely IBS/anxiety related Will try to get her started on a controller medication to see if she has improvement   Meds ordered this encounter  Medications  . clonazePAM (KLONOPIN) 1 MG tablet    Sig: Take 1 tablet by mouth 2 (two) times daily.  . mirtazapine (REMERON) 15 MG tablet    Sig: Take 1 tablet (15 mg total) by mouth at bedtime.    Dispense:  30 tablet    Refill:  2    Murtis SinkSam Johnnetta Holstine, MD [facility] 08/15/2016, 1:57 PM

## 2016-08-19 ENCOUNTER — Other Ambulatory Visit (INDEPENDENT_AMBULATORY_CARE_PROVIDER_SITE_OTHER): Payer: Commercial Managed Care - PPO

## 2016-08-19 DIAGNOSIS — E669 Obesity, unspecified: Secondary | ICD-10-CM

## 2016-08-19 DIAGNOSIS — F411 Generalized anxiety disorder: Secondary | ICD-10-CM

## 2016-08-20 LAB — CMP14+EGFR
ALK PHOS: 75 IU/L (ref 39–117)
ALT: 11 IU/L (ref 0–32)
AST: 11 IU/L (ref 0–40)
Albumin/Globulin Ratio: 1.7 (ref 1.2–2.2)
Albumin: 4.3 g/dL (ref 3.5–5.5)
BUN/Creatinine Ratio: 10 (ref 9–23)
BUN: 8 mg/dL (ref 6–24)
CHLORIDE: 102 mmol/L (ref 96–106)
CO2: 27 mmol/L (ref 18–29)
CREATININE: 0.78 mg/dL (ref 0.57–1.00)
Calcium: 9.2 mg/dL (ref 8.7–10.2)
GFR calc Af Amer: 110 mL/min/{1.73_m2} (ref >59)
GFR calc non Af Amer: 95 mL/min/{1.73_m2} (ref >59)
GLOBULIN, TOTAL: 2.5 g/dL (ref 1.5–4.5)
GLUCOSE: 95 mg/dL (ref 65–99)
Potassium: 4.9 mmol/L (ref 3.5–5.2)
SODIUM: 140 mmol/L (ref 134–144)
Total Protein: 6.8 g/dL (ref 6.0–8.5)

## 2016-08-20 LAB — CBC WITH DIFFERENTIAL/PLATELET
BASOS ABS: 0 10*3/uL (ref 0.0–0.2)
Basos: 0 %
EOS (ABSOLUTE): 0.3 10*3/uL (ref 0.0–0.4)
EOS: 3 %
HEMATOCRIT: 42.7 % (ref 34.0–46.6)
Hemoglobin: 14.1 g/dL (ref 11.1–15.9)
IMMATURE GRANULOCYTES: 0 %
Immature Grans (Abs): 0 10*3/uL (ref 0.0–0.1)
Lymphocytes Absolute: 2.3 10*3/uL (ref 0.7–3.1)
Lymphs: 27 %
MCH: 30.4 pg (ref 26.6–33.0)
MCHC: 33 g/dL (ref 31.5–35.7)
MCV: 92 fL (ref 79–97)
MONOS ABS: 0.6 10*3/uL (ref 0.1–0.9)
Monocytes: 7 %
NEUTROS PCT: 63 %
Neutrophils Absolute: 5.5 10*3/uL (ref 1.4–7.0)
PLATELETS: 368 10*3/uL (ref 150–379)
RBC: 4.64 x10E6/uL (ref 3.77–5.28)
RDW: 13.6 % (ref 12.3–15.4)
WBC: 8.7 10*3/uL (ref 3.4–10.8)

## 2016-08-20 LAB — LIPID PANEL
CHOLESTEROL TOTAL: 195 mg/dL (ref 100–199)
Chol/HDL Ratio: 4.9 ratio units — ABNORMAL HIGH (ref 0.0–4.4)
HDL: 40 mg/dL (ref >39)
LDL Calculated: 133 mg/dL — ABNORMAL HIGH (ref 0–99)
TRIGLYCERIDES: 110 mg/dL (ref 0–149)
VLDL Cholesterol Cal: 22 mg/dL (ref 5–40)

## 2016-08-20 LAB — TSH: TSH: 1.84 u[IU]/mL (ref 0.450–4.500)

## 2016-08-23 ENCOUNTER — Ambulatory Visit (INDEPENDENT_AMBULATORY_CARE_PROVIDER_SITE_OTHER): Payer: Commercial Managed Care - PPO | Admitting: Family Medicine

## 2016-08-23 ENCOUNTER — Encounter: Payer: Self-pay | Admitting: Family Medicine

## 2016-08-23 VITALS — BP 129/80 | HR 102 | Temp 98.2°F | Ht 66.0 in | Wt 195.8 lb

## 2016-08-23 DIAGNOSIS — B360 Pityriasis versicolor: Secondary | ICD-10-CM | POA: Diagnosis not present

## 2016-08-23 DIAGNOSIS — F411 Generalized anxiety disorder: Secondary | ICD-10-CM | POA: Diagnosis not present

## 2016-08-23 MED ORDER — CLONAZEPAM 0.5 MG PO TABS: 0.5000 mg | ORAL_TABLET | Freq: Two times a day (BID) | ORAL | 2 refills | Status: DC | PRN

## 2016-08-23 MED ORDER — FLUCONAZOLE 150 MG PO TABS: ORAL_TABLET | ORAL | 0 refills | Status: DC

## 2016-08-23 NOTE — Progress Notes (Signed)
   HPI  Patient presents today here to follow-up for anxiety, also rash.  Anxiety Stable, patient has been able to cut back on her Klonopin she is now down to 0.5 mg once on most days. Denies any suicidal thoughts or side effects from Remeron.  Rash White patches they get more obvious in the sunlight on her bilateral arms and chest, these of been there for over 2 years. They're not irritated. She states that her father and her sister also have the same soreness of rash. No one has any hypopigmented hair  PMH: Smoking status noted ROS: Per HPI  Objective: BP 129/80   Pulse (!) 102   Temp 98.2 F (36.8 C) (Oral)   Ht 5\' 6"  (1.676 m)   Wt 195 lb 12.8 oz (88.8 kg)   BMI 31.60 kg/m  Gen: NAD, alert, cooperative with exam HEENT: NCAT CV: RRR, good S1/S2, no murmur Resp: CTABL, no wheezes, non-labored Ext: No edema, warm Neuro: Alert and oriented, No gross deficits Psych- appropriate mood and affect- no SI  Skin:  50-100 roughly circular approximately 1-2 cm diameter hypopigmented lesions on the bilateral arms, and upper chest visible above her T-shirt   Assessment and plan:  # Generalized anxiety disorder Learning Remeron easily, symptoms stable Has cut back on Klonopin Refill Klonopin 0.5 mg twice daily as needed, this is a reduction from previous 2 mg total daily. Discussed seeing psychiatry, she has not called yet  # Tinea versicolor Most likely diagnosis, however could be hereditary hypopigmented patches given familial distribution Diflucan 300 mg, repeat in one week Dermatology if needed  Considered vitiligo, however the hyperpigmentation is not as complete as typical and vitiligo.    Meds ordered this encounter  Medications  . clonazePAM (KLONOPIN) 0.5 MG tablet    Sig: Take 1 tablet (0.5 mg total) by mouth 2 (two) times daily as needed for anxiety.    Dispense:  60 tablet    Refill:  2  . fluconazole (DIFLUCAN) 150 MG tablet    Sig: Take 2 pills, repeat  in 1 week    Dispense:  4 tablet    Refill:  0    For tinea versicolor    Murtis SinkSam Bradshaw, MD [facility] 08/23/2016, 4:12 PM

## 2016-08-23 NOTE — Patient Instructions (Signed)
Great to see you!  Be sure to call one of the psychiatrists on the list below

## 2016-09-03 ENCOUNTER — Telehealth: Payer: Self-pay | Admitting: Family Medicine

## 2016-09-03 MED ORDER — MIRTAZAPINE 30 MG PO TABS: 30.0000 mg | ORAL_TABLET | Freq: Every day | ORAL | 2 refills | Status: DC

## 2016-09-03 NOTE — Telephone Encounter (Signed)
I would recommend adjusting her dose, 30 mg each night.   She may take 2 of the current pills, I have sent 30 mg tabs to her pharmacy.   Murtis SinkSam Bradshaw, MD [facility] 09/03/2016, 8:33 AM

## 2016-09-03 NOTE — Addendum Note (Signed)
Addended by: Elenora GammaBRADSHAW, Camerin Ladouceur L on: 09/03/2016 08:43 AM   Modules accepted: Orders

## 2016-09-03 NOTE — Telephone Encounter (Signed)
Patient called stating that Remeron is not helping her sleep.  Patient would like to have something else sent to the pharmacy.

## 2016-09-03 NOTE — Telephone Encounter (Signed)
Patient aware to take 30mg  of Remeron nightly.

## 2016-09-05 ENCOUNTER — Telehealth (HOSPITAL_COMMUNITY): Payer: Self-pay | Admitting: *Deleted

## 2016-09-05 NOTE — Telephone Encounter (Signed)
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

## 2016-10-07 ENCOUNTER — Emergency Department (HOSPITAL_COMMUNITY): Payer: Commercial Managed Care - PPO

## 2016-10-07 ENCOUNTER — Encounter (HOSPITAL_COMMUNITY): Payer: Self-pay | Admitting: Emergency Medicine

## 2016-10-07 ENCOUNTER — Emergency Department (HOSPITAL_COMMUNITY)
Admission: EM | Admit: 2016-10-07 | Discharge: 2016-10-07 | Disposition: A | Discharge status: Home / Self Care | Payer: Commercial Managed Care - PPO | Attending: Emergency Medicine | Admitting: Emergency Medicine

## 2016-10-07 DIAGNOSIS — R1011 Right upper quadrant pain: Secondary | ICD-10-CM | POA: Diagnosis present

## 2016-10-07 DIAGNOSIS — R1013 Epigastric pain: Secondary | ICD-10-CM | POA: Diagnosis not present

## 2016-10-07 DIAGNOSIS — Z79899 Other long term (current) drug therapy: Secondary | ICD-10-CM | POA: Diagnosis not present

## 2016-10-07 DIAGNOSIS — F17210 Nicotine dependence, cigarettes, uncomplicated: Secondary | ICD-10-CM | POA: Diagnosis not present

## 2016-10-07 LAB — COMPREHENSIVE METABOLIC PANEL
ALT: 16 U/L (ref 14–54)
AST: 15 U/L (ref 15–41)
Albumin: 4 g/dL (ref 3.5–5.0)
Alkaline Phosphatase: 66 U/L (ref 38–126)
Anion gap: 6 (ref 5–15)
BILIRUBIN TOTAL: 0.4 mg/dL (ref 0.3–1.2)
BUN: 9 mg/dL (ref 6–20)
CHLORIDE: 103 mmol/L (ref 101–111)
CO2: 27 mmol/L (ref 22–32)
Calcium: 9.3 mg/dL (ref 8.9–10.3)
Creatinine, Ser: 0.89 mg/dL (ref 0.44–1.00)
GFR calc Af Amer: >60 mL/min (ref >60)
Glucose, Bld: 110 mg/dL — ABNORMAL HIGH (ref 65–99)
Potassium: 4.4 mmol/L (ref 3.5–5.1)
Sodium: 136 mmol/L (ref 135–145)
TOTAL PROTEIN: 7.7 g/dL (ref 6.5–8.1)

## 2016-10-07 LAB — URINALYSIS, ROUTINE W REFLEX MICROSCOPIC
Bilirubin Urine: NEGATIVE
GLUCOSE, UA: NEGATIVE mg/dL
HGB URINE DIPSTICK: NEGATIVE
Ketones, ur: NEGATIVE mg/dL
NITRITE: NEGATIVE
PH: 6 (ref 5.0–8.0)
PROTEIN: NEGATIVE mg/dL
SPECIFIC GRAVITY, URINE: 1.016 (ref 1.005–1.030)

## 2016-10-07 LAB — POC URINE PREG, ED: PREG TEST UR: NEGATIVE

## 2016-10-07 LAB — CBC
HCT: 42.6 % (ref 36.0–46.0)
HEMOGLOBIN: 14 g/dL (ref 12.0–15.0)
MCH: 30.4 pg (ref 26.0–34.0)
MCHC: 32.9 g/dL (ref 30.0–36.0)
MCV: 92.6 fL (ref 78.0–100.0)
Platelets: 332 10*3/uL (ref 150–400)
RBC: 4.6 MIL/uL (ref 3.87–5.11)
RDW: 13.3 % (ref 11.5–15.5)
WBC: 10.1 10*3/uL (ref 4.0–10.5)

## 2016-10-07 LAB — LIPASE, BLOOD: LIPASE: 25 U/L (ref 11–51)

## 2016-10-07 MED ORDER — ONDANSETRON 8 MG PO TBDP: 8.0000 mg | ORAL_TABLET | Freq: Three times a day (TID) | ORAL | 0 refills | Status: DC | PRN

## 2016-10-07 MED ORDER — FAMOTIDINE 20 MG PO TABS: 20.0000 mg | ORAL_TABLET | Freq: Two times a day (BID) | ORAL | 0 refills | Status: DC

## 2016-10-07 NOTE — Discharge Instructions (Signed)
Follow up with your primary care doctor if the symptoms persist, monitor for fever, worsening symptoms, try taking the antacid medications to see if they help with your symptoms

## 2016-10-07 NOTE — ED Triage Notes (Signed)
PT c/o RUQ pain that radiates into her back at times more after eating x2 days with one episode of diarrhea Saturday night. PT denies any n/v, urinary symptoms or vaginal discharge.

## 2016-10-07 NOTE — ED Provider Notes (Signed)
[facility]-EM[facility]Y DEPT Provider Note   CSN: [id number] Arrival date & time: 10/07/16  1034  By signing my name below, I, Sheila Schultz, attest that this documentation has been prepared under the direction and in the presence of Linwood Dibbles, MD. Electronically Signed: Thelma Schultz, Scribe. 10/07/16. 12:28 PM.  History   Chief Complaint Chief Complaint  Patient presents with  . Abdominal Pain   HPI Comments: Sheila Schultz is a 41 y.o. female who presents to the Emergency Department complaining of constant, upper abdominal pain that began two days ago. She states the has improved since onset but notes pain increases when she coughs. She states that two days ago, she could not walk due to the pain, but this has also improved and she went to work this morning before coming in to the ED. She denies fever and vomiting. She states has a previous Hx of problems with her gallbladder. No alleviating factors noted.    The history is provided by the patient. No language interpreter was used.    Past Medical History:  Diagnosis Date  . Anxiety   . Renal disorder     Patient Active Problem List   Diagnosis Date Noted  . GAD (generalized anxiety disorder) 08/15/2016  . Obesity (BMI 30-39.9) 08/15/2016    Past Surgical History:  Procedure Laterality Date  . ABLATION    . TONSILLECTOMY    . TUBAL LIGATION      OB History    No data available       Home Medications    Prior to Admission medications   Medication Sig Start Date End Date Taking? Authorizing Provider  clonazePAM (KLONOPIN) 0.5 MG tablet Take 1 tablet (0.5 mg total) by mouth 2 (two) times daily as needed for anxiety. 08/23/16  Yes Elenora Gamma, MD  famotidine (PEPCID) 20 MG tablet Take 1 tablet (20 mg total) by mouth 2 (two) times daily. 10/07/16   Linwood Dibbles, MD  fluconazole (DIFLUCAN) 150 MG tablet Take 2 pills, repeat in 1 week Patient not taking: Reported on 10/07/2016 08/23/16   Elenora Gamma, MD  mirtazapine  (REMERON) 30 MG tablet Take 1 tablet (30 mg total) by mouth at bedtime. Patient not taking: Reported on 10/07/2016 09/03/16   Elenora Gamma, MD  ondansetron (ZOFRAN ODT) 8 MG disintegrating tablet Take 1 tablet (8 mg total) by mouth every 8 (eight) hours as needed for nausea or vomiting. 10/07/16   Linwood Dibbles, MD    Family History Family History  Problem Relation Age of Onset  . Diabetes Mother   . Heart disease Mother   . Hyperlipidemia Mother     Social History Social History  Substance Use Topics  . Smoking status: Current Some Day Smoker    Packs/day: 0.25    Types: Cigarettes  . Smokeless tobacco: Never Used  . Alcohol use No     Allergies   Hydrocodone and Morphine and related   Review of Systems Review of Systems  Constitutional: Negative for fever.  Gastrointestinal: Positive for abdominal pain. Negative for vomiting.  All other systems reviewed and are negative.    Physical Exam Updated Vital Signs BP 126/84 (BP Location: Right Arm)   Pulse 96   Temp 98.3 F (36.8 C) (Oral)   Resp 18   Ht 5' 6.5" (1.689 m)   Wt 87.5 kg   SpO2 99%   BMI 30.68 kg/m   Physical Exam  Constitutional: She appears well-developed and well-nourished. No distress.  HENT:  Head: Normocephalic and atraumatic.  Right Ear: External ear normal.  Left Ear: External ear normal.  Eyes: Conjunctivae are normal. Right eye exhibits no discharge. Left eye exhibits no discharge. No scleral icterus.  Neck: Neck supple. No tracheal deviation present.  Cardiovascular: Normal rate, regular rhythm and intact distal pulses.   Pulmonary/Chest: Effort normal and breath sounds normal. No stridor. No respiratory distress. She has no wheezes. She has no rales.  Abdominal: Soft. Bowel sounds are normal. She exhibits no distension. There is tenderness in the right upper quadrant and epigastric area. There is no rebound and no guarding.  Musculoskeletal: She exhibits no edema or tenderness.    Neurological: She is alert. She has normal strength. No cranial nerve deficit (no facial droop, extraocular movements intact, no slurred speech) or sensory deficit. She exhibits normal muscle tone. She displays no seizure activity. Coordination normal.  Skin: Skin is warm and dry. No rash noted.  Psychiatric: She has a normal mood and affect.  Nursing note and vitals reviewed.    ED Treatments / Results  DIAGNOSTIC STUDIES: Oxygen Saturation is 99% on RA, normal by my interpretation.    COORDINATION OF CARE: 12:28 PM Discussed treatment plan with pt at bedside and pt agreed to plan.   Labs (all labs ordered are listed, but only abnormal results are displayed) Labs Reviewed  COMPREHENSIVE METABOLIC PANEL - Abnormal; Notable for the following:       Result Value   Glucose, Bld 110 (*)    All other components within normal limits  URINALYSIS, ROUTINE W REFLEX MICROSCOPIC - Abnormal; Notable for the following:    APPearance HAZY (*)    Leukocytes, UA MODERATE (*)    Bacteria, UA FEW (*)    Squamous Epithelial / LPF 6-30 (*)    All other components within normal limits  LIPASE, BLOOD  CBC    Radiology US Abdomen Complete  Result Date: 10/07/2016 CLINICAL DATA:  Upper abdominal pain for 2 days. EXAM: ABDOMEN ULTRASOUND COMPLETE COMPARISON:  CT abdomen and pelvis 05/21/2012. FINDINGS: Gallbladder: No gallstones or wall thickening visualized. No sonographic Murphy sign noted by sonographer. Common bile duct: Diameter: Normal measuring 2.8 mm. Liver: No focal lesion identified. Within normal limits in parenchymal echogenicity. IVC: No abnormality visualized. Pancreas: Visualized portion unremarkable. Spleen: Size and appearance within normal limits. Right Kidney: Length: 10.5 cm. Echogenicity within normal limits. No mass or hydronephrosis visualized. Left Kidney: Length: 10.8 cm. Echogenicity within normal limits. No mass or hydronephrosis visualized. Abdominal aorta: No aneurysm  visualized. Other findings: None. IMPRESSION: Negative exam. Electronically Signed   By: Elsie Stain M.D.   On: 10/07/2016 13:05    Procedures Procedures (including critical care time)  Medications Ordered in ED Medications - No data to display   Initial Impression / Assessment and Plan / ED Course  I have reviewed the triage vital signs and the nursing notes.  Pertinent labs & imaging results that were available during my care of the patient were reviewed by me and considered in my medical decision making (see chart for details).    Pt presents to the ED with upper abdominal pain.   Lab tests are reassuring.  UA is likely contaminated.  She denies any urinary symptoms. [place] is negative.  No cholecystitis or cholelithiasis.  Will dc home with antacids and antiemetics.  Follow up with PCP    Final Clinical Impressions(s) / ED Diagnoses   Final diagnoses:  Right upper quadrant abdominal pain    New Prescriptions  New Prescriptions   FAMOTIDINE (PEPCID) 20 MG TABLET    Take 1 tablet (20 mg total) by mouth 2 (two) times daily.   ONDANSETRON (ZOFRAN ODT) 8 MG DISINTEGRATING TABLET    Take 1 tablet (8 mg total) by mouth every 8 (eight) hours as needed for nausea or vomiting.  I personally performed the services described in this documentation, which was scribed in my presence.  The recorded information has been reviewed and is accurate.    Linwood Dibbles, MD 10/07/16 [phone]

## 2016-10-16 ENCOUNTER — Ambulatory Visit (INDEPENDENT_AMBULATORY_CARE_PROVIDER_SITE_OTHER): Payer: Commercial Managed Care - PPO | Admitting: Family Medicine

## 2016-10-16 ENCOUNTER — Ambulatory Visit (INDEPENDENT_AMBULATORY_CARE_PROVIDER_SITE_OTHER): Payer: Commercial Managed Care - PPO

## 2016-10-16 VITALS — BP 122/84 | HR 106 | Temp 97.6°F | Ht 66.5 in | Wt 194.0 lb

## 2016-10-16 DIAGNOSIS — M25562 Pain in left knee: Secondary | ICD-10-CM | POA: Diagnosis not present

## 2016-10-16 NOTE — Progress Notes (Signed)
Subjective:  Patient ID: Sheila Schultz, female    DOB: Jul 18, 1975  Age: 41 y.o. MRN: [id number]  CC: Knee Pain (L knee pain after fall, non weight bearing)   HPI Sebastian Basquez presents for Filling a hole when she was walking to her vehicle this afternoon. Pain continued to get worse and worse and today she presented here about 3 hours later. She has become nonweightbearing due to 10/10 pain. Pain is in the posterior aspect of the left knee. When she fell she twisted she's not sure exactly what the mechanism of the fall was. She doesn't remember valgus varus or other motion.   History Shuronda has a past medical history of Anxiety and Renal disorder.   She has a past surgical history that includes Tonsillectomy; Tubal ligation; and Ablation.   Her family history includes Diabetes in her mother; Heart disease in her mother; Hyperlipidemia in her mother.She reports that she has been smoking Cigarettes.  She has been smoking about 0.25 packs per day. She has never used smokeless tobacco. She reports that she does not drink alcohol or use drugs.    ROS Review of Systems Noncontributory Objective:  BP 122/84 (BP Location: Left Arm, Patient Position: Sitting, Cuff Size: Normal)   Pulse (!) 106   Temp 97.6 F (36.4 C) (Oral)   Ht 5' 6.5" (1.689 m)   Wt 194 lb (88 kg)   BMI 30.84 kg/m   BP Readings from Last 3 Encounters:  10/16/16 122/84  10/07/16 112/71  08/23/16 129/80    Wt Readings from Last 3 Encounters:  10/16/16 194 lb (88 kg)  10/07/16 193 lb (87.5 kg)  08/23/16 195 lb 12.8 oz (88.8 kg)     Physical Exam   Patient has moderate distress due to her pain. The left knee is tender to palpation. This is most prominent at the posterior joint but also at the medial patellar plica. There is guarding and resistance to motion for flexion and extension. She is holding it firmly at about 20 flexion. Unable to perform valgus and varus maneuvers or McMurray's test today. X-ray  performed shows no sign of fracture. Edema is moderate without any hematoma formation.  Assessment & Plan:   Iyari was seen today for knee pain.  Diagnoses and all orders for this visit:  Acute pain of left knee -     DG Knee 1-2 Views Left; Future   X-ray: No fracture noted.    I am having Ms. Halley maintain her clonazePAM, fluconazole, mirtazapine, famotidine, and ondansetron.  Allergies as of 10/16/2016      Reactions   Hydrocodone Nausea And Vomiting   Morphine And Related Nausea Only      Medication List       Accurate as of 10/16/16  5:20 PM. Always use your most recent med list.          clonazePAM 0.5 MG tablet Commonly known as:  KLONOPIN Take 1 tablet (0.5 mg total) by mouth 2 (two) times daily as needed for anxiety.   famotidine 20 MG tablet Commonly known as:  PEPCID Take 1 tablet (20 mg total) by mouth 2 (two) times daily.   fluconazole 150 MG tablet Commonly known as:  DIFLUCAN Take 2 pills, repeat in 1 week   mirtazapine 30 MG tablet Commonly known as:  REMERON Take 1 tablet (30 mg total) by mouth at bedtime.   ondansetron 8 MG disintegrating tablet Commonly known as:  ZOFRAN ODT Take 1 tablet (8 mg total)  by mouth every 8 (eight) hours as needed for nausea or vomiting.      Patient states that she is intolerant of all narcotics/opiates due to profuse vomiting. She would like to avoid using them. She agrees to take over-the-counter ibuprofen 600 mg 3 times a day-4 times a day when necessary for the pain. She is to use ice packs. She should be nonweightbearing. Crutches and a knee brace were dispensed.  Follow-up: Return in about 5 days (around 10/21/2016).  Mechele Claude, M.D.

## 2016-10-17 ENCOUNTER — Ambulatory Visit: Payer: Self-pay | Admitting: Family Medicine

## 2016-10-17 ENCOUNTER — Telehealth: Payer: Self-pay | Admitting: Family Medicine

## 2016-10-17 ENCOUNTER — Other Ambulatory Visit: Payer: Self-pay | Admitting: Family Medicine

## 2016-10-17 MED ORDER — TRAMADOL HCL 50 MG PO TABS: 50.0000 mg | ORAL_TABLET | Freq: Four times a day (QID) | ORAL | Status: DC | PRN

## 2016-10-17 NOTE — Telephone Encounter (Signed)
Patient aware and verbalizes understanding. Patient states she does not want to chance the Tramadol due to her being afraid it would make her sick. Patient states she will keep using ibuprofen and let us know if it does not improve.

## 2016-10-17 NOTE — Telephone Encounter (Signed)
Patient seen [facility] yesterday for Left knee pain. Patient states her knee hurts worse then it did yesterday. She is also having a hard time bending or straightening her knee- worse than she did yesterday. States she has even cried due to the pain and at this point she thinks she would need some type of pain medication. Hydro and morphine give patient nausea and would like to know if Dr. Darlyn ReadStacks thinks Tramadol could help her? Also states that this morning she woke up with a big knot in the side of her left knee. Wanting to know if doctor thinks it could be torn ligaments in her knee? Please advise and send back to the pools.

## 2016-10-17 NOTE — Telephone Encounter (Signed)
Please contact the patient She does have torn ligaments. That is what a sprain is. Tramadol can cause the same problems as other opiates, but she can have a scrip if she wants to try it.

## 2016-10-21 ENCOUNTER — Encounter: Payer: Self-pay | Admitting: Family Medicine

## 2016-10-21 ENCOUNTER — Ambulatory Visit (INDEPENDENT_AMBULATORY_CARE_PROVIDER_SITE_OTHER): Payer: Commercial Managed Care - PPO | Admitting: Family Medicine

## 2016-10-21 ENCOUNTER — Other Ambulatory Visit: Payer: Self-pay

## 2016-10-21 VITALS — BP 110/71 | HR 98 | Temp 98.2°F | Ht 66.5 in | Wt 195.0 lb

## 2016-10-21 DIAGNOSIS — M25362 Other instability, left knee: Secondary | ICD-10-CM

## 2016-10-21 DIAGNOSIS — M2392 Unspecified internal derangement of left knee: Secondary | ICD-10-CM

## 2016-10-21 DIAGNOSIS — M25562 Pain in left knee: Secondary | ICD-10-CM

## 2016-10-21 NOTE — Progress Notes (Signed)
Subjective:  Patient ID: Sheila Schultz, female    DOB: 11/04/1975  Age: 41 y.o. MRN: [id number]  CC: Knee Pain (pt here today following up on her left knee after falling. Her knee is still hurting and she is wearing the knee hinge brace all the time except for at night while sleeping.)   HPI Sheila Schultz presents for Worsening pain and instability unable to bear weight. She was seen 5 days ago with a fall and moderately severe knee pain. In spite of medication that pain persists. She is also wearing her hinged brace when she is on her feet. However she is hobbling and unsteady and feels that she is going to fall any moment. Pain is throughout the knee joint but also has noted a tender knot toward the lateral superior aspect of the joint.  History Sheila Schultz has a past medical history of Anxiety and Renal disorder.   She has a past surgical history that includes Tonsillectomy; Tubal ligation; and Ablation.   Her family history includes Diabetes in her mother; Heart disease in her mother; Hyperlipidemia in her mother.She reports that she has been smoking Cigarettes.  She has been smoking about 0.25 packs per day. She has never used smokeless tobacco. She reports that she does not drink alcohol or use drugs.  Current Outpatient Prescriptions on File Prior to Visit  Medication Sig Dispense Refill  . clonazePAM (KLONOPIN) 0.5 MG tablet Take 1 tablet (0.5 mg total) by mouth 2 (two) times daily as needed for anxiety. 60 tablet 2  . famotidine (PEPCID) 20 MG tablet Take 1 tablet (20 mg total) by mouth 2 (two) times daily. 14 tablet 0  . mirtazapine (REMERON) 30 MG tablet Take 1 tablet (30 mg total) by mouth at bedtime. 30 tablet 2  . ondansetron (ZOFRAN ODT) 8 MG disintegrating tablet Take 1 tablet (8 mg total) by mouth every 8 (eight) hours as needed for nausea or vomiting. 12 tablet 0  . traMADol (ULTRAM) 50 MG tablet Take 1 tablet (50 mg total) by mouth 4 (four) times daily as needed for moderate  pain. (Patient not taking: Reported on 10/21/2016) 60 tablet 02   No current facility-administered medications on file prior to visit.     ROS Review of Systems  Constitutional: Positive for activity change. Negative for appetite change, chills, diaphoresis and fever.  HENT: Negative.   Respiratory: Negative.   Cardiovascular: Negative.   Gastrointestinal: Negative.   Musculoskeletal: Positive for arthralgias (left knee).  Neurological: Negative for dizziness.  Psychiatric/Behavioral: Negative for behavioral problems and decreased concentration.    Objective:  BP 110/71   Pulse 98   Temp 98.2 F (36.8 C) (Oral)   Ht 5' 6.5" (1.689 m)   Wt 195 lb (88.5 kg)   BMI 31.00 kg/m   Physical Exam  Constitutional: She appears well-developed and well-nourished.  HENT:  Head: Normocephalic.  Cardiovascular: Normal rate and regular rhythm.   No murmur heard. Pulmonary/Chest: Effort normal and breath sounds normal.  Musculoskeletal: She exhibits tenderness.  The left knee is markedly tender. There is 2+ effusion.No hematoma. Tenderness for palpation laterally. Too tender for full evaluation of varus valgus stress. Some opening laterally.Lateral meniscus likely positive on McMurray. Anterior drawer and Lachman open 1/2 cm    Assessment & Plan:   Sheila Schultz was seen today for knee pain.  Diagnoses and all orders for this visit:  Instability of knee joint, left -     MR KNEE LEFT W CONTRAST; Future -  Ambulatory referral to Orthopedics  Internal derangement of knee joint, left -     MR KNEE LEFT W CONTRAST; Future -     Ambulatory referral to Orthopedics     Patient to be nonweightbearing with crutches and brace anytime her knee is lower than her waist.   Follow-up: Return in about 2 weeks (around 11/04/2016).  Mechele Claude, M.D.

## 2016-10-22 ENCOUNTER — Telehealth: Payer: Self-pay | Admitting: Family Medicine

## 2016-10-22 NOTE — Telephone Encounter (Signed)
Has it felt better for just a day, or longer? If a long time, then could postpone.

## 2016-10-22 NOTE — Telephone Encounter (Signed)
Covering PCP- please advise  

## 2016-10-22 NOTE — Telephone Encounter (Signed)
Patient states it has only been feeling better for today after she heard her knee pop. Patient states she will call back before MRI and let us know how it is doing.

## 2016-10-25 ENCOUNTER — Ambulatory Visit (HOSPITAL_COMMUNITY): Payer: Commercial Managed Care - PPO

## 2019-08-21 ENCOUNTER — Ambulatory Visit: Payer: Self-pay | Attending: Internal Medicine

## 2020-12-12 ENCOUNTER — Ambulatory Visit (INDEPENDENT_AMBULATORY_CARE_PROVIDER_SITE_OTHER): Payer: 59

## 2020-12-12 ENCOUNTER — Other Ambulatory Visit: Payer: Self-pay

## 2020-12-12 ENCOUNTER — Encounter: Payer: Self-pay | Admitting: Emergency Medicine

## 2020-12-12 ENCOUNTER — Ambulatory Visit
Admission: EM | Admit: 2020-12-12 | Discharge: 2020-12-12 | Disposition: A | Discharge status: Home / Self Care | Payer: 59 | Attending: Physician Assistant | Admitting: Physician Assistant

## 2020-12-12 DIAGNOSIS — S93402A Sprain of unspecified ligament of left ankle, initial encounter: Secondary | ICD-10-CM | POA: Diagnosis not present

## 2020-12-12 DIAGNOSIS — M25572 Pain in left ankle and joints of left foot: Secondary | ICD-10-CM

## 2020-12-12 NOTE — Discharge Instructions (Addendum)
Wear brace for the next 10 days.  See your Physician for recheck

## 2020-12-12 NOTE — ED Triage Notes (Signed)
LT ankle swelling and pain after stepping in a hole in the garden last night.

## 2020-12-19 NOTE — ED Provider Notes (Signed)
[facility]    CSN: [id number] Arrival date & time: 12/12/20  1033      History   Chief Complaint Chief Complaint  Patient presents with   Ankle Pain    HPI Sheila Schultz is a 45 y.o. female.   Pt stepped in a hole and injured her left ankle.  Pt complains of swelling and pain   The history is provided by the patient. No language interpreter was used.  Ankle Pain Location:  Ankle Time since incident:  1 day Injury: yes   Mechanism of injury: fall   Fall:    Entrapped after fall: no   Ankle location:  L ankle Pain details:    Quality:  Aching   Severity:  Moderate   Onset quality:  Gradual   Duration:  1 day   Timing:  Constant   Progression:  Worsening Chronicity:  New Associated symptoms: no fever    Past Medical History:  Diagnosis Date   Anxiety    Renal disorder     Patient Active Problem List   Diagnosis Date Noted   GAD (generalized anxiety disorder) 08/15/2016   Obesity (BMI 30-39.9) 08/15/2016    Past Surgical History:  Procedure Laterality Date   ABLATION     TONSILLECTOMY     TUBAL LIGATION      OB History   No obstetric history on file.      Home Medications    Prior to Admission medications   Medication Sig Start Date End Date Taking? Authorizing Provider  clonazePAM (KLONOPIN) 0.5 MG tablet Take 1 tablet (0.5 mg total) by mouth 2 (two) times daily as needed for anxiety. 08/23/16   Elenora Gamma, MD  famotidine (PEPCID) 20 MG tablet Take 1 tablet (20 mg total) by mouth 2 (two) times daily. 10/07/16   Linwood Dibbles, MD  mirtazapine (REMERON) 30 MG tablet Take 1 tablet (30 mg total) by mouth at bedtime. 09/03/16   Elenora Gamma, MD  ondansetron (ZOFRAN ODT) 8 MG disintegrating tablet Take 1 tablet (8 mg total) by mouth every 8 (eight) hours as needed for nausea or vomiting. 10/07/16   Linwood Dibbles, MD  traMADol (ULTRAM) 50 MG tablet Take 1 tablet (50 mg total) by mouth 4 (four) times daily as needed for moderate  pain. Patient not taking: Reported on 10/21/2016 10/17/16   Mechele Claude, MD    Family History Family History  Problem Relation Age of Onset   Diabetes Mother    Heart disease Mother    Hyperlipidemia Mother     Social History Social History   Tobacco Use   Smoking status: Some Days    Packs/day: 0.25    Pack years: 0.00    Types: Cigarettes   Smokeless tobacco: Never  Substance Use Topics   Alcohol use: No   Drug use: No     Allergies   Hydrocodone and Morphine and related   Review of Systems Review of Systems  Constitutional:  Negative for fever.  Musculoskeletal:  Positive for arthralgias, gait problem and joint swelling.  All other systems reviewed and are negative.   Physical Exam Triage Vital Signs ED Triage Vitals  Enc Vitals Group     BP 12/12/20 1051 124/77     Pulse Rate 12/12/20 1051 (!) 103     Resp 12/12/20 1051 19     Temp 12/12/20 1051 98 F (36.7 C)     Temp Source 12/12/20 1051 Oral     SpO2 12/12/20  1051 98 %     Weight --      Height --      Head Circumference --      Peak Flow --      Pain Score 12/12/20 1050 7     Pain Loc --      Pain Edu? --      Excl. in GC? --    No data found.  Updated Vital Signs BP 124/77 (BP Location: Right Arm)   Pulse (!) 103   Temp 98 F (36.7 C) (Oral)   Resp 19   SpO2 98%   Visual Acuity Right Eye Distance:   Left Eye Distance:   Bilateral Distance:    Right Eye Near:   Left Eye Near:    Bilateral Near:     Physical Exam Vitals reviewed.  Constitutional:      Appearance: Normal appearance.  HENT:     Head: Normocephalic.  Cardiovascular:     Rate and Rhythm: Normal rate.  Pulmonary:     Effort: Pulmonary effort is normal.  Musculoskeletal:        General: Swelling and tenderness present.  Skin:    General: Skin is warm.  Neurological:     General: No focal deficit present.     Mental Status: She is alert.  Psychiatric:        Mood and Affect: Mood normal.     UC  Treatments / Results  Labs (all labs ordered are listed, but only abnormal results are displayed) Labs Reviewed - No data to display  EKG   Radiology No results found.  Procedures Procedures (including critical care time)  Medications Ordered in UC Medications - No data to display  Initial Impression / Assessment and Plan / UC Course  I have reviewed the triage vital signs and the nursing notes.  Pertinent labs & imaging results that were available during my care of the patient were reviewed by me and considered in my medical decision making (see chart for details).     MDM:  Xray left ankle  no fracture .  Pt placed ina  splint.  Pt counseled on follow up  Final Clinical Impressions(s) / UC Diagnoses   Final diagnoses:  Mild ankle sprain, left, initial encounter     Discharge Instructions      Wear brace for the next 10 days.  See your Physician for recheck   ED Prescriptions   None   An After Visit Summary was printed and given to the patient.  PDMP not reviewed this encounter.    Elson Areas, [place] 12/19/20 [phone]

## 2021-04-19 ENCOUNTER — Telehealth: Payer: Self-pay

## 2021-04-19 NOTE — Telephone Encounter (Signed)
NOTES SCANNED TO REFERRAL 

## 2021-06-29 DIAGNOSIS — G43009 Migraine without aura, not intractable, without status migrainosus: Secondary | ICD-10-CM | POA: Diagnosis not present

## 2021-07-03 DIAGNOSIS — R519 Headache, unspecified: Secondary | ICD-10-CM | POA: Diagnosis not present

## 2021-07-03 DIAGNOSIS — J029 Acute pharyngitis, unspecified: Secondary | ICD-10-CM | POA: Diagnosis not present

## 2021-07-03 DIAGNOSIS — J020 Streptococcal pharyngitis: Secondary | ICD-10-CM | POA: Diagnosis not present

## 2021-07-11 DIAGNOSIS — R0600 Dyspnea, unspecified: Secondary | ICD-10-CM | POA: Diagnosis not present

## 2021-07-12 DIAGNOSIS — R059 Cough, unspecified: Secondary | ICD-10-CM | POA: Diagnosis not present

## 2021-07-16 NOTE — Progress Notes (Deleted)
Cardiology Office Note:    Date:  07/16/2021   ID:  Sheila Schultz, DOB [birth year], MRN [id number]  PCP:  Pcp, No   [facility] Providers Cardiologist:  None { Click to update primary MD,subspecialty MD or APP then REFRESH:1}    Referring MD: Rebekah Chesterfield, NP   CC: *** Consulted for the evaluation of palpitations at the behest of Pcp, No   History of Present Illness:    Sheila Schultz is a 46 y.o. female with a hx of anxiety and obesity who presents for evaluation 07/17/21.  Patient notes that (s)he is feeling ***.  Has had no chest pain, chest pressure, chest tightness, chest stinging ***.  Discomfort occurs with ***, worsens with ***, and improves with ***.  Patient exertion notable for *** with *** and feels no symptoms.  No shortness of breath, DOE ***.  No PND or orthopnea***.  No weight gain***, leg swelling ***, or abdominal swelling***.  No syncope or near syncope ***. Notes *** no palpitations or funny heart beats.     Patient reports prior cardiac testing including ***  No history of ***pre-eclampsia, gestation HTN or gestational DM.  No Fen-Phen or drug use***.  Ambulatory BP ***.   Past Medical History:  Diagnosis Date   Anemia    Anxiety    Foot pain, left    Gall stones    Palpitations    Renal disorder     Past Surgical History:  Procedure Laterality Date   ABLATION     TONSILLECTOMY     TUBAL LIGATION      Current Medications: No outpatient medications have been marked as taking for the 07/17/21 encounter (Appointment) with Christell Constant, MD.     Allergies:   Hydrocodone and Morphine and related   Social History   Socioeconomic History   Marital status: Married    Spouse name: Not on file   Number of children: Not on file   Years of education: Not on file   Highest education level: Not on file  Occupational History   Not on file  Tobacco Use   Smoking status: Some Days    Packs/day: 0.25    Types: Cigarettes    Smokeless tobacco: Never  Substance and Sexual Activity   Alcohol use: No   Drug use: No   Sexual activity: Yes    Birth control/protection: Surgical  Other Topics Concern   Not on file  Social History Narrative   Not on file   Social Determinants of Health   Financial Resource Strain: Not on file  Food Insecurity: Not on file  Transportation Needs: Not on file  Physical Activity: Not on file  Stress: Not on file  Social Connections: Not on file     Family History: The patient's family history includes Diabetes in her mother; Heart disease in her mother; Hyperlipidemia in her mother.  ROS:   Please see the history of present illness.     All other systems reviewed and are negative.  EKGs/Labs/Other Studies Reviewed:    The following studies were reviewed today:   EKG:  EKG is  ordered today.  The ekg ordered today demonstrates  ***  Recent Labs: No results found for requested labs within last 8760 hours.  Recent Lipid Panel    Component Value Date/Time   CHOL 195 08/19/2016 0828   TRIG 110 08/19/2016 0828   HDL 40 08/19/2016 0828   CHOLHDL 4.9 (H) 08/19/2016 0828   LDLCALC 133 (H)  08/19/2016 0828     Risk Assessment/Calculations:   {Does this patient have ATRIAL FIBRILLATION?:[phone]}       Physical Exam:    VS:  There were no vitals taken for this visit.    Wt Readings from Last 3 Encounters:  10/21/16 88.5 kg  10/16/16 88 kg  10/07/16 87.5 kg     Gen: *** distress, *** obese/well nourished/malnourished   Neck: No JVD, *** carotid bruit Ears: Homero Fellers Sign Cardiac: No Rubs or Gallops, *** Murmur, ***cardia, *** radial pulses Respiratory: Clear to auscultation bilaterally, *** effort, ***  respiratory rate GI: Soft, nontender, non-distended *** MS: No *** edema; *** moves all extremities Integument: Skin feels *** Neuro:  At time of evaluation, alert and oriented to person/place/time/situation *** Psych: Normal affect, patient feels  ***   ASSESSMENT:    No diagnosis found. PLAN:    Palpitations; possible SVT PACs/PVCs*** - will obtain ***-day live/non live heart monitor (ZioPatch/Pre[employer]ice) - AV Nodal Therapy: *** - AAD: - EP evaluation:         {Are you ordering a CV Procedure (e.g. stress test, cath, DCCV, TEE, etc)?   Press F2        :[id number]}    Medication Adjustments/Labs and Tests Ordered: Current medicines are reviewed at length with the patient today.  Concerns regarding medicines are outlined above.  No orders of the defined types were placed in this encounter.  No orders of the defined types were placed in this encounter.   There are no Patient Instructions on file for this visit.   Signed, Christell Constant, MD  07/16/2021 7:54 AM    [facility]

## 2021-07-17 ENCOUNTER — Ambulatory Visit: Payer: Self-pay | Admitting: Internal Medicine

## 2021-08-01 NOTE — Progress Notes (Signed)
Cardiology Office Note:    Date:  08/03/2021   ID:  Sheila Schultz, DOB 10/23/1975, MRN [id number]  PCP:  Rebekah Chesterfieldickey, Kirkland M, NP   [facility] Providers Cardiologist:  Christell ConstantMahesh A Jhordyn Hoopingarner, MD     Referring MD: Rebekah Chesterfieldickey, Kirkland M, NP   CC: Dizziness Consulted for the evaluation of SVT at the behest of Rebekah Chesterfieldickey, Kirkland M, NP  History of Present Illness:    Sheila CrockJaclyn Schultz is a 46 y.o. female with a hx of palpitations and anxiety, former smoker who presents for evaluation 08/03/21.  Patient notes that she is feeling lightheaded when she when she puts her arms over her head she feels dizziness.  Works drying hair, getting stuff out of a cabinet, or reaching up at the grocery store.  Has had no chest pain, chest pressure, chest stinging. Has chest tightness with stress. [place] two kids.  No chest tightness with activity.  No shortness of breath, DOE.  No PND or orthopnea.    Prior history of syncope: 10 years ago was passing out   Heart rate is frequently- can go up at work, with stress, or getting up to do chores.  Worse with sit to stand.  Has a lot of walking for work but does not exercise.   Past Medical History:  Diagnosis Date   Anemia    Anxiety    Foot pain, left    Gall stones    Palpitations    Renal disorder     Past Surgical History:  Procedure Laterality Date   ABLATION     TONSILLECTOMY     TUBAL LIGATION      Current Medications: Current Meds  Medication Sig   clindamycin (CLEOCIN T) 1 % external solution Apply topically 2 (two) times daily as needed.   clindamycin (CLEOCIN T) 1 % lotion Apply topically 2 (two) times daily as needed.   clonazePAM (KLONOPIN) 1 MG tablet Take 1 mg by mouth 3 (three) times daily.   ibuprofen (ADVIL) 800 MG tablet as needed.   metoprolol succinate (TOPROL-XL) 25 MG 24 hr tablet Take 25 mg by mouth daily.   omeprazole (PRILOSEC) 40 MG capsule Take 40 mg by mouth  daily.   ondansetron (ZOFRAN ODT) 8 MG disintegrating tablet Take 1 tablet (8 mg total) by mouth every 8 (eight) hours as needed for nausea or vomiting.   [DISCONTINUED] famotidine (PEPCID) 20 MG tablet Take 1 tablet (20 mg total) by mouth 2 (two) times daily.   [DISCONTINUED] mirtazapine (REMERON) 30 MG tablet Take 1 tablet (30 mg total) by mouth at bedtime.   [DISCONTINUED] traMADol (ULTRAM) 50 MG tablet Take 1 tablet (50 mg total) by mouth 4 (four) times daily as needed for moderate pain.     Allergies:   Hydrocodone and Morphine and related   Social History   Socioeconomic History   Marital status: Married    Spouse name: Not on file   Number of children: Not on file   Years of education: Not on file   Highest education level: Not on file  Occupational History   Not on file  Tobacco Use   Smoking status: Some Days    Packs/day: 0.25    Types: Cigarettes   Smokeless tobacco: Never  Substance and Sexual Activity   Alcohol use: No   Drug use: No   Sexual activity: Yes    Birth control/protection: Surgical  Other Topics Concern   Not on file  Social History Narrative   Not on file   Social Determinants of Health   Financial Resource Strain: Not on file  Food Insecurity: Not on file  Transportation Needs: Not on file  Physical Activity: Not on file  Stress: Not on file  Social Connections: Not on file    08/03/21: I see her husband, Sheila Schultz  Family History: The patient's family history includes Diabetes in her mother; Heart disease in her mother; Hyperlipidemia in her mother. Mother has AF had it age 98s. Grandfather had 5 heart attacks and heart failure. Family history of aortic dissection (at childbirth)  ROS:   Please see the history of present illness.     All other systems reviewed and are negative.  EKGs/Labs/Other Studies Reviewed:    The following studies were reviewed today:  EKG:  EKG is  ordered today.  The ekg ordered today demonstrates  08/03/21: SR  rate 98  Recent Labs: No results found for requested labs within last 8760 hours.  Recent Lipid Panel    Component Value Date/Time   CHOL 195 08/19/2016 0828   TRIG 110 08/19/2016 0828   HDL 40 08/19/2016 0828   CHOLHDL 4.9 (H) 08/19/2016 0828   LDLCALC 133 (H) 08/19/2016 0828        Physical Exam:    VS:  BP 140/80 Comment: right arm   Pulse 96    Ht 5' 6.5" (1.689 m)    Wt 91.6 kg    BMI 32.12 kg/m     Wt Readings from Last 3 Encounters:  08/03/21 91.6 kg  10/21/16 88.5 kg  10/16/16 88 kg    R arm BP 140/80 L arm BP 110/68  Gen: no distress   Neck: No JVD, no carotid bruit Cardiac: No Rubs or Gallops, no Murmur, RRR +2 radial pulses no subclavian bruit Respiratory: Clear to auscultation bilaterally, normal effort, normal  respiratory rate GI: Soft, nontender, non-distended  MS: No  edema;  moves all extremities Integument: Skin feels warm, the is slight discoloration in forearms after BP check Neuro:  At time of evaluation, alert and oriented to person/place/time/situation  Psych: Normal affect, patient feels anxious   ASSESSMENT:    1. Subclavian steal syndrome   2. Syncope, unspecified syncope type   3. Palpitations    PLAN:    Query of subclavian steal syndrome Palpitations Syncope FH of aortic dissection - will get echo, Ziopatch and UE Duplex - based on results may due Gated CT Aorta - continue metoprolol - if no issues may need ivabradine for a POTS related phenotype  Spring follow up           Medication Adjustments/Labs and Tests Ordered: Current medicines are reviewed at length with the patient today.  Concerns regarding medicines are outlined above.  Orders Placed This Encounter  Procedures   LONG TERM MONITOR (3-14 DAYS)   EKG 12-Lead   ECHOCARDIOGRAM COMPLETE   VAS [place] UPPER EXTREMITY ARTERIAL DUPLEX   No orders of the defined types were placed in this encounter.   Patient Instructions  Medication Instructions:  Your physician  recommends that you continue on your current medications as directed. Please refer to the Current Medication list given to you today.  *If you need a refill on your cardiac medications before your next appointment, please call your pharmacy*   Lab Work: NONE If you have labs (blood work) drawn today and your tests are completely normal, you will receive your results only by: MyChart Message (if you  have MyChart) OR A paper copy in the mail If you have any lab test that is abnormal or we need to change your treatment, we will call you to review the results.   Testing/Procedures: Your physician has requested that you wear a 14 day heart monitor.   Your physician has requested that you have an echocardiogram. Echocardiography is a painless test that uses sound waves to create images of your heart. It provides your doctor with information about the size and shape of your heart and how well your hearts chambers and valves are working. This procedure takes approximately one hour. There are no restrictions for this procedure.  Your physician has requested that you have an upper extremity arterial duplex. This test is an ultrasound of the arteries in the arms. It looks at arterial blood flow in the arms.  There are no restrictions or special instructions    Follow-Up: At [facility], you and your health needs are our priority.  As part of our continuing mission to provide you with exceptional heart care, we have created designated Provider Care Teams.  These Care Teams include your primary Cardiologist (physician) and Advanced Practice Providers (APPs -  Physician Assistants and Nurse Practitioners) who all work together to provide you with the care you need, when you need it.  We recommend signing up for the patient portal called "MyChart".  Sign up information is provided on this After Visit Summary.  MyChart is used to connect with patients for Virtual Visits (Telemedicine).  Patients are able  to view lab/test results, encounter notes, upcoming appointments, etc.  Non-urgent messages can be sent to your provider as well.   To learn more about what you can do with MyChart, go to [email or web address].    Your next appointment:   3 month(s)  The format for your next appointment:   In Person  Provider:   Christell Constant, MD     Other Instructions   Christena Deem- Long Term Monitor Instructions  Your physician has requested you wear a ZIO patch monitor for 14 days.  This is a single patch monitor. [employer] supplies one patch monitor per enrollment. Additional stickers are not available. Please do not apply patch if you will be having a Nuclear Stress Test,  Echocardiogram, Cardiac CT, MRI, or Chest Xray during the period you would be wearing the  monitor. The patch cannot be worn during these tests. You cannot remove and re-apply the  ZIO XT patch monitor.  Your ZIO patch monitor will be mailed 3 day USPS to your address on file. It may take 3-5 days  to receive your monitor after you have been enrolled.  Once you have received your monitor, please review the enclosed instructions. Your monitor  has already been registered assigning a specific monitor serial # to you.  Billing and Patient Assistance Program Information  We have supplied [employer] with any of your insurance information on file for billing purposes. [employer] offers a sliding scale Patient Assistance Program for patients that do not have  insurance, or whose insurance does not completely cover the cost of the ZIO monitor.  You must apply for the Patient Assistance Program to qualify for this discounted rate.  To apply, please call [employer] at [phone], select option 4, select option 2, ask to apply for  Patient Assistance Program. Meredeth Ide will ask your household income, and how many people  are in your household. They will quote your out-of-pocket cost based on that information.  [employer]  will also be able  to set up a 73-month, interest-free payment plan if needed.  Applying the monitor   Shave hair from upper left chest.  Hold abrader disc by orange tab. Rub abrader in 40 strokes over the upper left chest as  indicated in your monitor instructions.  Clean area with 4 enclosed alcohol pads. Let dry.  Apply patch as indicated in monitor instructions. Patch will be placed under collarbone on left  side of chest with arrow pointing upward.  Rub patch adhesive wings for 2 minutes. Remove white label marked "1". Remove the white  label marked "2". Rub patch adhesive wings for 2 additional minutes.  While looking in a mirror, press and release button in center of patch. A small green light will  flash 3-4 times. This will be your only indicator that the monitor has been turned on.  Do not shower for the first 24 hours. You may shower after the first 24 hours.  Press the button if you feel a symptom. You will hear a small click. Record Date, Time and  Symptom in the Patient Logbook.  When you are ready to remove the patch, follow instructions on the last 2 pages of Patient  Logbook. Stick patch monitor onto the last page of Patient Logbook.  Place Patient Logbook in the blue and white box. Use locking tab on box and tape box closed  securely. The blue and white box has prepaid postage on it. Please place it in the mailbox as  soon as possible. Your physician should have your test results approximately 7 days after the  monitor has been mailed back to [facility].  Call [facility] Customer Care at [phone] if you have questions regarding  your ZIO XT patch monitor. Call them immediately if you see an orange light blinking on your  monitor.  If your monitor falls off in less than 4 days, contact our Monitor department at [phone].  If your monitor becomes loose or falls off after 4 days call [employer] at [phone] for  suggestions on securing your monitor     Signed, Christell Constant, MD  08/03/2021 11:18 AM    [facility]

## 2021-08-03 ENCOUNTER — Ambulatory Visit: Payer: 59 | Admitting: Internal Medicine

## 2021-08-03 ENCOUNTER — Ambulatory Visit (INDEPENDENT_AMBULATORY_CARE_PROVIDER_SITE_OTHER): Payer: 59

## 2021-08-03 ENCOUNTER — Encounter: Payer: Self-pay | Admitting: Internal Medicine

## 2021-08-03 ENCOUNTER — Other Ambulatory Visit: Payer: Self-pay

## 2021-08-03 VITALS — BP 140/80 | HR 96 | Ht 66.5 in | Wt 202.0 lb

## 2021-08-03 DIAGNOSIS — R55 Syncope and collapse: Secondary | ICD-10-CM

## 2021-08-03 DIAGNOSIS — R002 Palpitations: Secondary | ICD-10-CM | POA: Diagnosis not present

## 2021-08-03 DIAGNOSIS — G458 Other transient cerebral ischemic attacks and related syndromes: Secondary | ICD-10-CM

## 2021-08-03 NOTE — Patient Instructions (Signed)
Medication Instructions:  Your physician recommends that you continue on your current medications as directed. Please refer to the Current Medication list given to you today.  *If you need a refill on your cardiac medications before your next appointment, please call your pharmacy*   Lab Work: NONE If you have labs (blood work) drawn today and your tests are completely normal, you will receive your results only by: MyChart Message (if you have MyChart) OR A paper copy in the mail If you have any lab test that is abnormal or we need to change your treatment, we will call you to review the results.   Testing/Procedures: Your physician has requested that you wear a 14 day heart monitor.   Your physician has requested that you have an echocardiogram. Echocardiography is a painless test that uses sound waves to create images of your heart. It provides your doctor with information about the size and shape of your heart and how well your hearts chambers and valves are working. This procedure takes approximately one hour. There are no restrictions for this procedure.  Your physician has requested that you have an upper extremity arterial duplex. This test is an ultrasound of the arteries in the arms. It looks at arterial blood flow in the arms.  There are no restrictions or special instructions    Follow-Up: At [facility], you and your health needs are our priority.  As part of our continuing mission to provide you with exceptional heart care, we have created designated Provider Care Teams.  These Care Teams include your primary Cardiologist (physician) and Advanced Practice Providers (APPs -  Physician Assistants and Nurse Practitioners) who all work together to provide you with the care you need, when you need it.  We recommend signing up for the patient portal called "MyChart".  Sign up information is provided on this After Visit Summary.  MyChart is used to connect with patients for Virtual  Visits (Telemedicine).  Patients are able to view lab/test results, encounter notes, upcoming appointments, etc.  Non-urgent messages can be sent to your provider as well.   To learn more about what you can do with MyChart, go to [email or web address].    Your next appointment:   3 month(s)  The format for your next appointment:   In Person  Provider:   Christell Constant, MD     Other Instructions   Christena Deem- Long Term Monitor Instructions  Your physician has requested you wear a ZIO patch monitor for 14 days.  This is a single patch monitor. [employer] supplies one patch monitor per enrollment. Additional stickers are not available. Please do not apply patch if you will be having a Nuclear Stress Test,  Echocardiogram, Cardiac CT, MRI, or Chest Xray during the period you would be wearing the  monitor. The patch cannot be worn during these tests. You cannot remove and re-apply the  ZIO XT patch monitor.  Your ZIO patch monitor will be mailed 3 day USPS to your address on file. It may take 3-5 days  to receive your monitor after you have been enrolled.  Once you have received your monitor, please review the enclosed instructions. Your monitor  has already been registered assigning a specific monitor serial # to you.  Billing and Patient Assistance Program Information  We have supplied [employer] with any of your insurance information on file for billing purposes. [employer] offers a sliding scale Patient Assistance Program for patients that do not have  insurance, or whose insurance  does not completely cover the cost of the ZIO monitor.  You must apply for the Patient Assistance Program to qualify for this discounted rate.  To apply, please call [employer] at [phone], select option 4, select option 2, ask to apply for  Patient Assistance Program. Meredeth Ide will ask your household income, and how many people  are in your household. They will quote your out-of-pocket cost based on that  information.  [employer] will also be able to set up a 38-month, interest-free payment plan if needed.  Applying the monitor   Shave hair from upper left chest.  Hold abrader disc by orange tab. Rub abrader in 40 strokes over the upper left chest as  indicated in your monitor instructions.  Clean area with 4 enclosed alcohol pads. Let dry.  Apply patch as indicated in monitor instructions. Patch will be placed under collarbone on left  side of chest with arrow pointing upward.  Rub patch adhesive wings for 2 minutes. Remove white label marked "1". Remove the white  label marked "2". Rub patch adhesive wings for 2 additional minutes.  While looking in a mirror, press and release button in center of patch. A small green light will  flash 3-4 times. This will be your only indicator that the monitor has been turned on.  Do not shower for the first 24 hours. You may shower after the first 24 hours.  Press the button if you feel a symptom. You will hear a small click. Record Date, Time and  Symptom in the Patient Logbook.  When you are ready to remove the patch, follow instructions on the last 2 pages of Patient  Logbook. Stick patch monitor onto the last page of Patient Logbook.  Place Patient Logbook in the blue and white box. Use locking tab on box and tape box closed  securely. The blue and white box has prepaid postage on it. Please place it in the mailbox as  soon as possible. Your physician should have your test results approximately 7 days after the  monitor has been mailed back to [facility].  Call [facility] Customer Care at [phone] if you have questions regarding  your ZIO XT patch monitor. Call them immediately if you see an orange light blinking on your  monitor.  If your monitor falls off in less than 4 days, contact our Monitor department at [phone].  If your monitor becomes loose or falls off after 4 days call [employer] at [phone] for  suggestions on  securing your monitor

## 2021-08-03 NOTE — Progress Notes (Unsigned)
Enrolled pt for 14 day Zio XT to be mailed to home address. °

## 2021-08-07 DIAGNOSIS — R55 Syncope and collapse: Secondary | ICD-10-CM | POA: Diagnosis not present

## 2021-08-07 DIAGNOSIS — R002 Palpitations: Secondary | ICD-10-CM | POA: Diagnosis not present

## 2021-08-07 DIAGNOSIS — G458 Other transient cerebral ischemic attacks and related syndromes: Secondary | ICD-10-CM | POA: Diagnosis not present

## 2021-08-14 ENCOUNTER — Other Ambulatory Visit: Payer: Self-pay | Admitting: Internal Medicine

## 2021-08-14 DIAGNOSIS — G458 Other transient cerebral ischemic attacks and related syndromes: Secondary | ICD-10-CM

## 2021-08-14 DIAGNOSIS — R55 Syncope and collapse: Secondary | ICD-10-CM

## 2021-08-14 DIAGNOSIS — R002 Palpitations: Secondary | ICD-10-CM

## 2021-08-15 ENCOUNTER — Telehealth: Payer: Self-pay | Admitting: Internal Medicine

## 2021-08-15 NOTE — Telephone Encounter (Signed)
Patient would like to know if heart monitor will interfere with 3/02 echo. Please advise. ?

## 2021-08-15 NOTE — Telephone Encounter (Signed)
Called pt informed that can not wear heart monitor during echocardiogram.  Pt expresses did not think about this before applying monitor last Tuesday.  Gave pt 2 options: end heart monitor study early or push out echocardiogram.  Pt expresses is experiencing palpitations advised to reschedule echo to a later date.  Pt expressed will call back in at a later time to reschedule echocardiogram.   ?

## 2021-08-16 ENCOUNTER — Other Ambulatory Visit (HOSPITAL_COMMUNITY): Payer: 59

## 2021-08-20 ENCOUNTER — Ambulatory Visit (HOSPITAL_COMMUNITY): Payer: 59

## 2021-08-24 ENCOUNTER — Ambulatory Visit (HOSPITAL_COMMUNITY)
Admission: RE | Admit: 2021-08-24 | Discharge: 2021-08-24 | Disposition: A | Discharge status: Home / Self Care | Payer: 59 | Source: Ambulatory Visit | Attending: Cardiovascular Disease | Admitting: Cardiovascular Disease

## 2021-08-24 ENCOUNTER — Other Ambulatory Visit: Payer: Self-pay

## 2021-08-24 ENCOUNTER — Ambulatory Visit (HOSPITAL_BASED_OUTPATIENT_CLINIC_OR_DEPARTMENT_OTHER): Payer: 59

## 2021-08-24 DIAGNOSIS — R55 Syncope and collapse: Secondary | ICD-10-CM | POA: Insufficient documentation

## 2021-08-24 DIAGNOSIS — R002 Palpitations: Secondary | ICD-10-CM | POA: Insufficient documentation

## 2021-08-24 DIAGNOSIS — G458 Other transient cerebral ischemic attacks and related syndromes: Secondary | ICD-10-CM

## 2021-08-24 LAB — ECHOCARDIOGRAM COMPLETE
Area-P 1/2: 5.16 cm2
S' Lateral: 2.4 cm

## 2021-08-27 ENCOUNTER — Encounter: Payer: Self-pay | Admitting: Internal Medicine

## 2021-08-27 DIAGNOSIS — R002 Palpitations: Secondary | ICD-10-CM | POA: Diagnosis not present

## 2021-08-27 DIAGNOSIS — G458 Other transient cerebral ischemic attacks and related syndromes: Secondary | ICD-10-CM | POA: Diagnosis not present

## 2021-08-27 DIAGNOSIS — R55 Syncope and collapse: Secondary | ICD-10-CM | POA: Diagnosis not present

## 2021-08-28 MED ORDER — METOPROLOL SUCCINATE ER 50 MG PO TB24: 50.0000 mg | ORAL_TABLET | Freq: Every day | ORAL | 3 refills | Status: DC

## 2021-10-22 DIAGNOSIS — J029 Acute pharyngitis, unspecified: Secondary | ICD-10-CM | POA: Diagnosis not present

## 2021-10-27 NOTE — Progress Notes (Deleted)
Cardiology Office Note:    Date:  10/27/2021   ID:  Sheila Schultz, DOB 12-May-1976, MRN [id number]  PCP:  Adaline Sill, NP   [facility] Providers Cardiologist:  Werner Lean, MD     Referring MD: Adaline Sill, NP   CC: Dizziness  Blood pressures both arms***  History of Present Illness:    Sheila Schultz is a 46 y.o. female with a hx of palpitations and anxiety, former smoker who presents for evaluation 08/03/21.  Had Echo, ZioPatch, and UE duplex all were unremarkable.  Patient notes that she is doing ***.   Since day prior/last visit notes *** . There are no*** interval hospital/ED visit.    No chest pain or pressure ***.  No SOB/DOE*** and no PND/Orthopnea***.  No weight gain or leg swelling***.  No palpitations or syncope ***.  Ambulatory blood pressure ***.   Past Medical History:  Diagnosis Date   Anemia    Anxiety    Foot pain, left    Gall stones    Palpitations    Renal disorder     Past Surgical History:  Procedure Laterality Date   ABLATION     TONSILLECTOMY     TUBAL LIGATION      Current Medications: No outpatient medications have been marked as taking for the 10/29/21 encounter (Appointment) with Werner Lean, MD.     Allergies:   Hydrocodone and Morphine and related   Social History   Socioeconomic History   Marital status: Married    Spouse name: Not on file   Number of children: Not on file   Years of education: Not on file   Highest education level: Not on file  Occupational History   Not on file  Tobacco Use   Smoking status: Some Days    Packs/day: 0.25    Types: Cigarettes   Smokeless tobacco: Never  Substance and Sexual Activity   Alcohol use: No   Drug use: No   Sexual activity: Yes    Birth control/protection: Surgical  Other Topics Concern   Not on file  Social History Narrative   Not on file   Social Determinants of Health   Financial Resource Strain: Not on file  Food  Insecurity: Not on file  Transportation Needs: Not on file  Physical Activity: Not on file  Stress: Not on file  Social Connections: Not on file    08/03/21: I see her husband, Lennette Bihari, CASE Work for Hom[employer] in [place]  Family History: The patient's family history includes Diabetes in her mother; Heart disease in her mother; Hyperlipidemia in her mother. Mother has AF had it age 22s. Grandfather had 5 heart attacks and heart failure. Family history of aortic dissection (at childbirth)  ROS:   Please see the history of present illness.     All other systems reviewed and are negative.  EKGs/Labs/Other Studies Reviewed:    The following studies were reviewed today:  EKG:   08/03/21: SR rate 98  Recent Labs: No results found for requested labs within last 8760 hours.  Recent Lipid Panel    Component Value Date/Time   CHOL 195 08/19/2016 0828   TRIG 110 08/19/2016 0828   HDL 40 08/19/2016 0828   CHOLHDL 4.9 (H) 08/19/2016 0828   LDLCALC 133 (H) 08/19/2016 [id number]        Physical Exam:    VS:  There were no vitals taken for this visit.    Wt Readings from Last 3  Encounters:  08/03/21 202 lb (91.6 kg)  10/21/16 195 lb (88.5 kg)  10/16/16 194 lb (88 kg)    Gen: no distress   Neck: No JVD, no carotid bruit Cardiac: No Rubs or Gallops, no Murmur, RRR +2 radial pulses no subclavian bruit Respiratory: Clear to auscultation bilaterally, normal effort, normal  respiratory rate GI: Soft, nontender, non-distended  MS: No  edema;  moves all extremities Integument: Skin feels warm, the is slight discoloration in forearms after BP check Neuro:  At time of evaluation, alert and oriented to person/place/time/situation  Psych: Normal affect, patient feels anxious   ASSESSMENT:    No diagnosis found.  PLAN:    Query of subclavian steal syndrome - negative testing Palpitations Syncope FH of aortic dissection - based on results may due Gated CT Aorta *** - continue  metoprolol - if no issues may need ivabradine for a POTS related phenotype BB  Spring follow up           Medication Adjustments/Labs and Tests Ordered: Current medicines are reviewed at length with the patient today.  Concerns regarding medicines are outlined above.  No orders of the defined types were placed in this encounter.  No orders of the defined types were placed in this encounter.   There are no Patient Instructions on file for this visit.   Signed, Werner Lean, MD  10/27/2021 5:50 PM    [place]

## 2021-10-29 ENCOUNTER — Ambulatory Visit: Payer: 59 | Admitting: Internal Medicine

## 2021-11-21 ENCOUNTER — Ambulatory Visit: Payer: Commercial Managed Care - PPO | Admitting: Nurse Practitioner

## 2021-12-18 IMAGING — DX DG ANKLE COMPLETE 3+V*L*
3 series · 3 of 3 positions shown · non-contrast
Comparison: None.

CLINICAL DATA: Fall with left ankle pain, initial encounter

EXAM:
LEFT ANKLE COMPLETE - 3+ VIEW

[ankle ap]
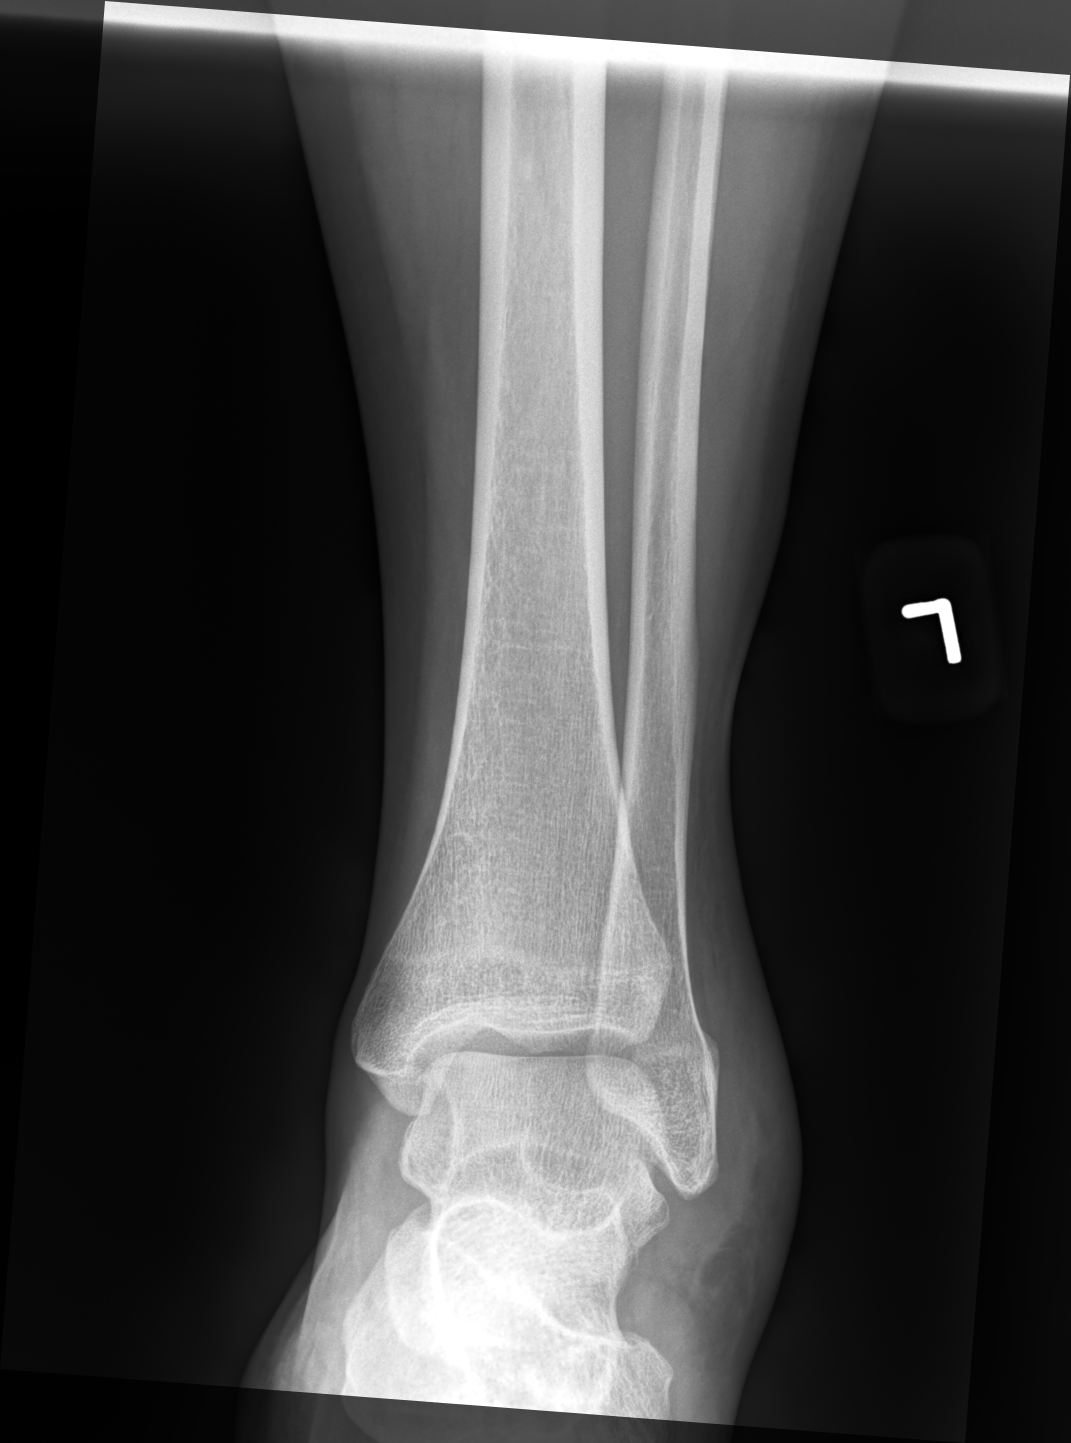

[ankle mlo]
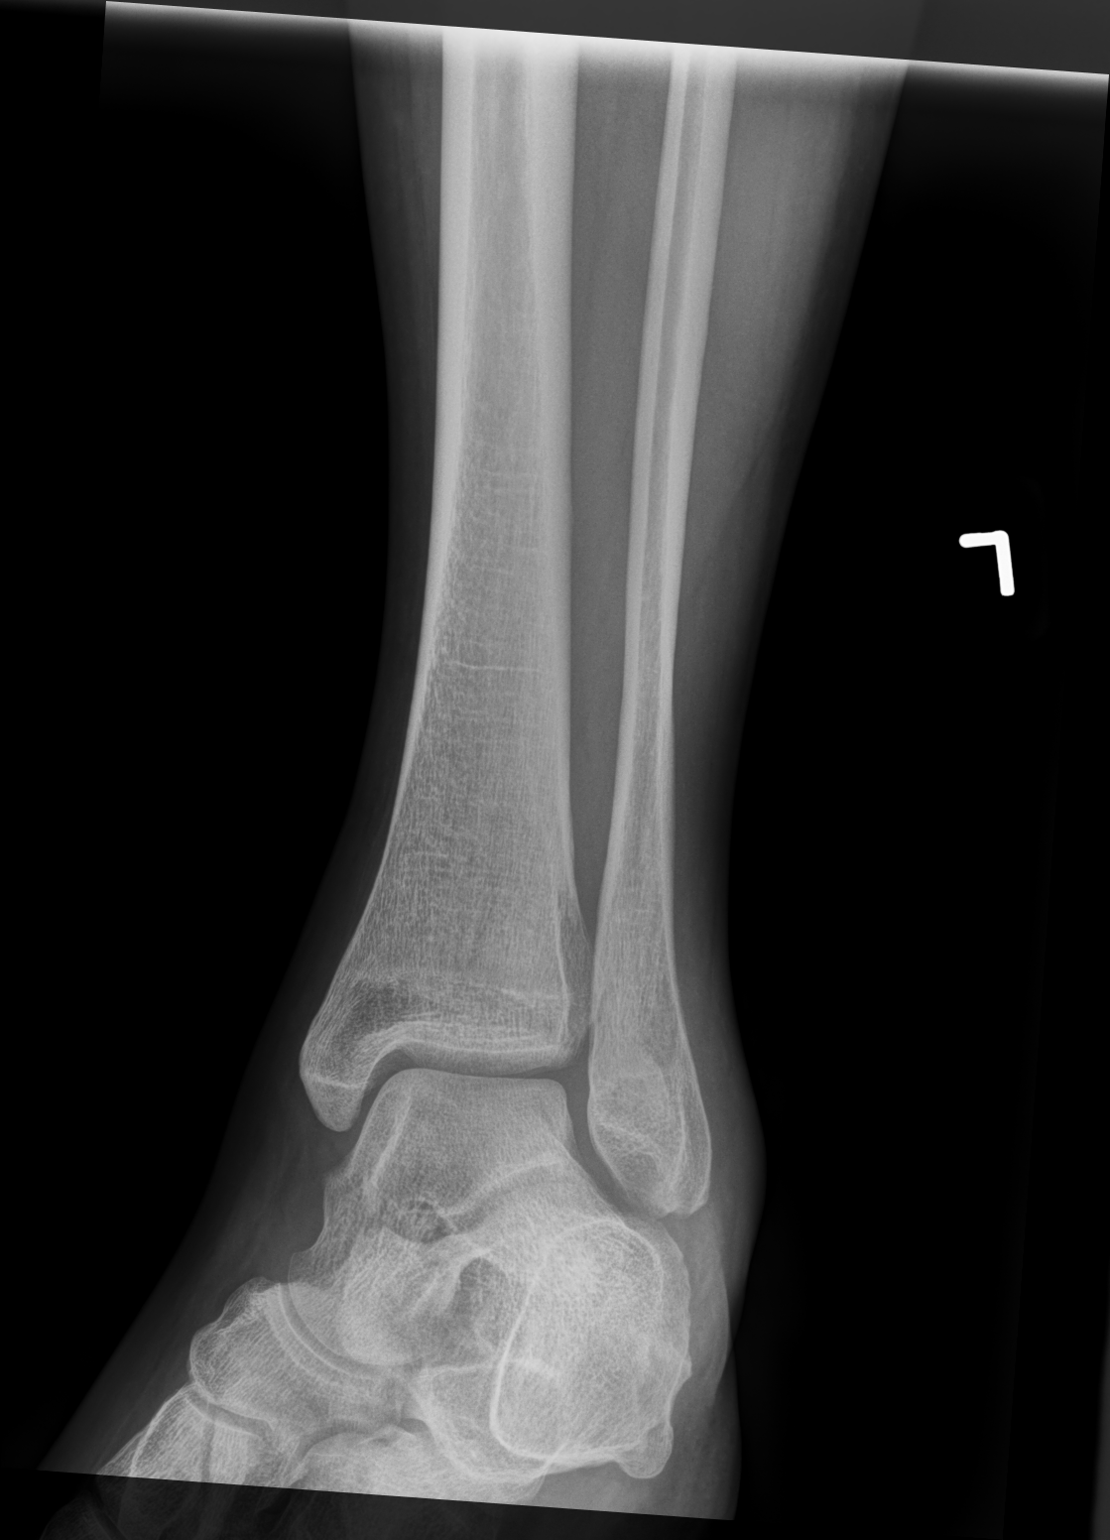

[ankle lat]
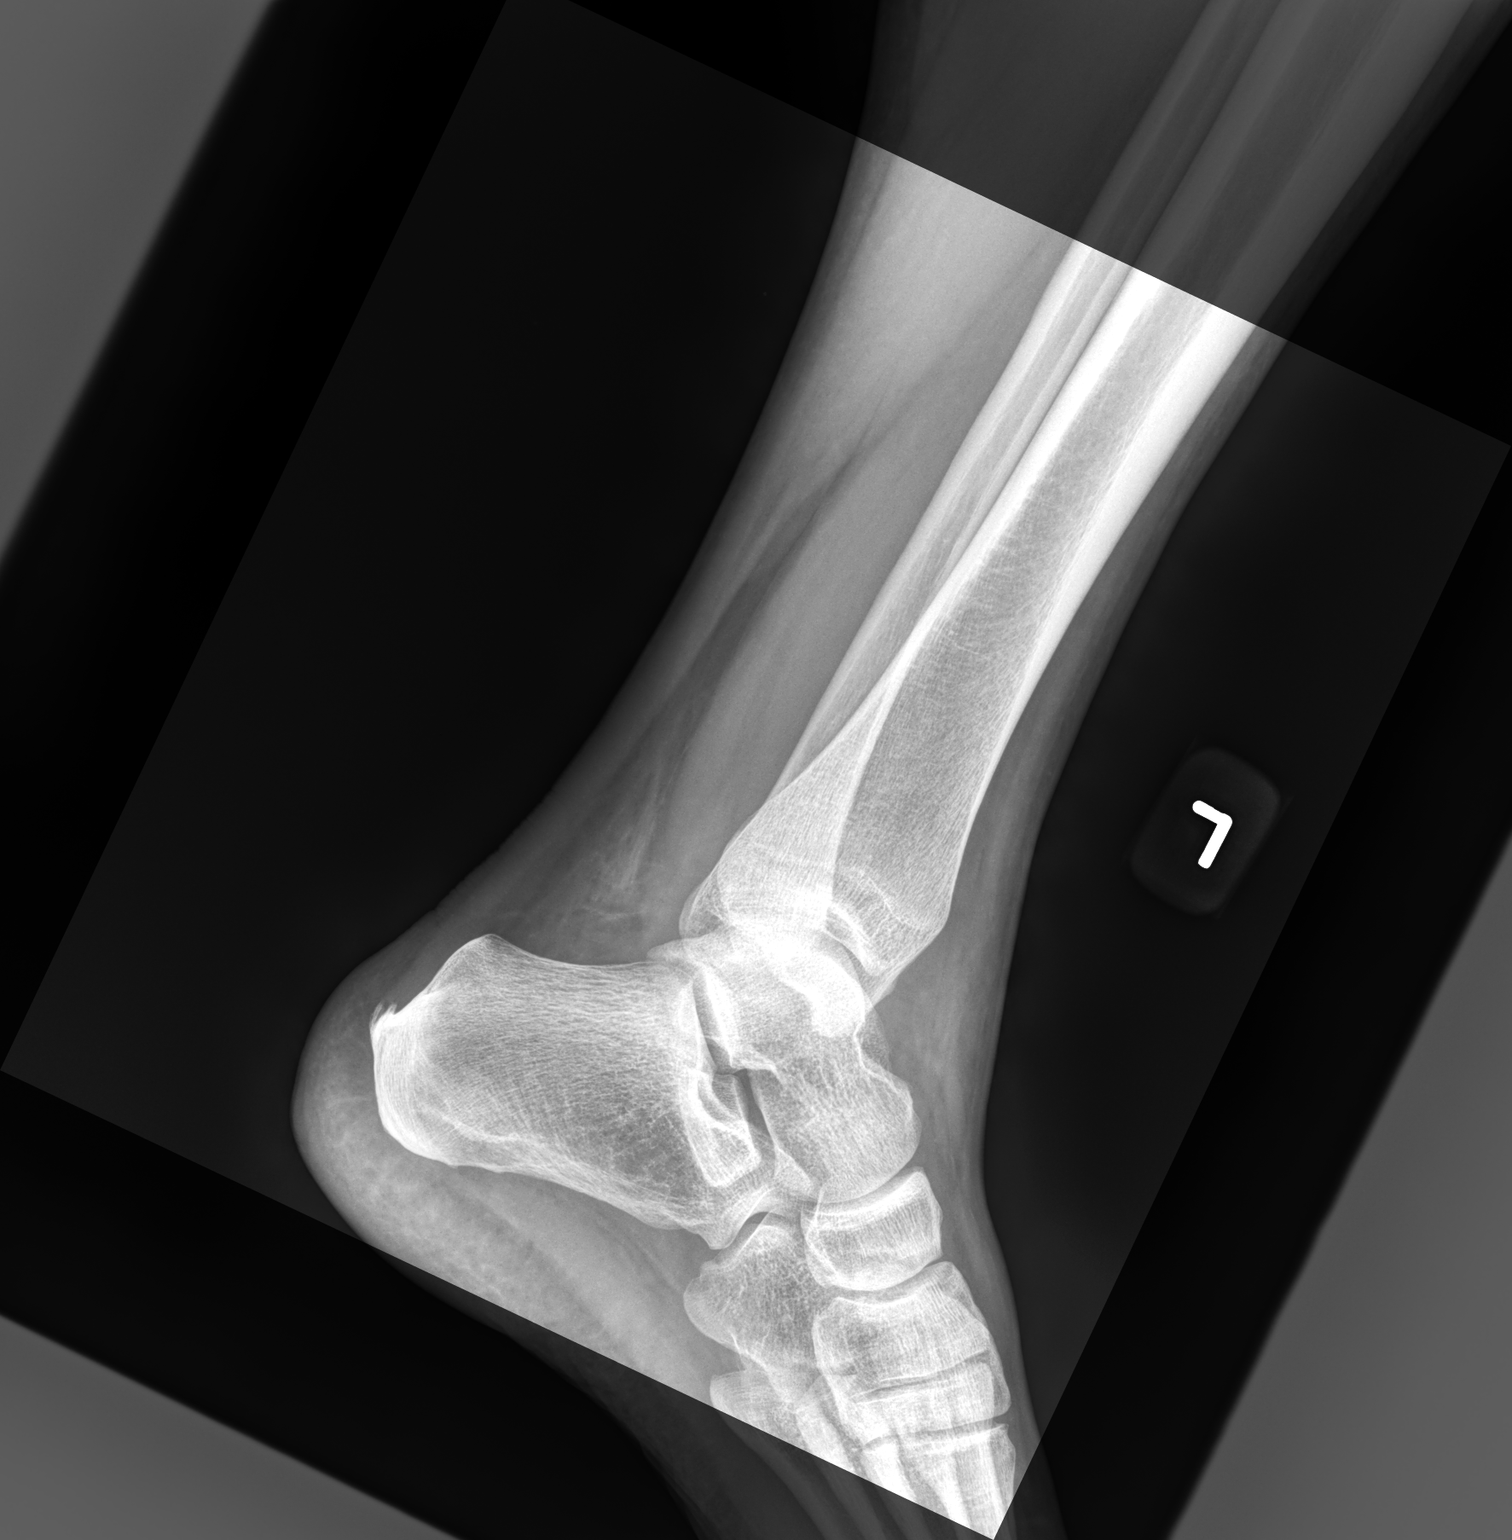

[3 of 3 positions shown; findings below may reference images not displayed]

FINDINGS: Lateral soft tissue swelling is noted. No acute fracture or
dislocation is noted.
IMPRESSION: Soft tissue swelling without acute bony abnormality.

## 2021-12-21 DIAGNOSIS — J329 Chronic sinusitis, unspecified: Secondary | ICD-10-CM | POA: Diagnosis not present

## 2022-02-22 DIAGNOSIS — G4700 Insomnia, unspecified: Secondary | ICD-10-CM | POA: Diagnosis not present

## 2022-02-22 DIAGNOSIS — K219 Gastro-esophageal reflux disease without esophagitis: Secondary | ICD-10-CM | POA: Diagnosis not present

## 2022-02-22 DIAGNOSIS — R69 Illness, unspecified: Secondary | ICD-10-CM | POA: Diagnosis not present

## 2022-03-01 DIAGNOSIS — J0110 Acute frontal sinusitis, unspecified: Secondary | ICD-10-CM | POA: Diagnosis not present

## 2022-03-15 DIAGNOSIS — R69 Illness, unspecified: Secondary | ICD-10-CM | POA: Diagnosis not present

## 2022-03-18 ENCOUNTER — Telehealth: Payer: Self-pay

## 2022-03-18 ENCOUNTER — Encounter: Payer: Self-pay | Admitting: Nurse Practitioner

## 2022-03-18 ENCOUNTER — Ambulatory Visit (INDEPENDENT_AMBULATORY_CARE_PROVIDER_SITE_OTHER): Payer: 59 | Admitting: Nurse Practitioner

## 2022-03-18 VITALS — BP 110/72 | HR 91 | Temp 98.6°F | Ht 66.0 in | Wt 176.2 lb

## 2022-03-18 DIAGNOSIS — R69 Illness, unspecified: Secondary | ICD-10-CM | POA: Diagnosis not present

## 2022-03-18 DIAGNOSIS — F339 Major depressive disorder, recurrent, unspecified: Secondary | ICD-10-CM | POA: Diagnosis not present

## 2022-03-18 DIAGNOSIS — F411 Generalized anxiety disorder: Secondary | ICD-10-CM

## 2022-03-18 DIAGNOSIS — Z0283 Encounter for blood-alcohol and blood-drug test: Secondary | ICD-10-CM

## 2022-03-18 MED ORDER — DESVENLAFAXINE SUCCINATE ER 25 MG PO TB24: 25.0000 mg | ORAL_TABLET | Freq: Every day | ORAL | 2 refills | Status: DC

## 2022-03-18 NOTE — Telephone Encounter (Signed)
Sheila Schultz [name] - Rx #: [id number] Need help? Call us at [phone] Outcome Additional Information Required Your PA has been resolved, no additional PA is required. For further inquiries please contact the number on the back of the member prescription card. (Message [phone]) Drug Desvenlafaxine Succinate ER 25MG  er tablets Form [employer] Electronic PA Form [phone] NCPDP)

## 2022-03-18 NOTE — Progress Notes (Signed)
New Patient Note  RE: Sheila Schultz MRN: [id number] DOB: Oct 18, 1975 Date of Office Visit: 03/18/2022  Chief Complaint: Establish Care and Depression  History of Present Illness: Depression: Patient complains of depression. She complains of anhedonia, depressed mood, difficulty concentrating, fatigue, hopelessness, and insomnia. Onset was approximately several years ago, gradually worsening since that time.  She denies current suicidal and homicidal plan or intent.   Family history significant for no psychiatric illness.Possible organic causes contributing are: none.  Risk factors: previous episode of depression Previous treatment includes  Prozac  and  clonopin . She complains of the following side effects from the treatment: none.   [place] Office Visit from 03/18/2022 in [place]  PHQ-9 Total Score 22      Anxiety: Patient complains of anxiety disorder.  She has the following symptoms: difficulty concentrating, fatigue, feelings of losing control, insomnia, irritable. Onset of symptoms was approximately a few years ago, unchanged since that time. She denies current suicidal and homicidal ideation. Family history significant for no psychiatric illness.Possible organic causes contributing are: none. Risk factors: previous episode of depression Previous treatment includes  colonipin  and  prozac .  She complains of the following side effects from the treatment: none.      03/18/2022   12:00 PM 08/15/2016    1:20 PM  GAD 7 : Generalized Anxiety Score  Nervous, Anxious, on Edge 1 2  Control/stop worrying 3 3  Worry too much - different things 3 3  Trouble relaxing 3 3  Restless 3 3  Easily annoyed or irritable 3 3  Afraid - awful might happen 1 2  Total GAD 7 Score 17 19  Anxiety Difficulty  Not difficult at all      Assessment and Plan: Sheila Schultz is a 46 y.o. female with: GAD (generalized anxiety disorder) Establishing care today with symptoms of  depression and anxiety.  Currently on Klonopin which manages anxiety.  I provided education to patient on long-term use/safety and side effects of Klonopin. Patient will continue on the same medication and on the same dose as she has been on this medication since high school.  Printed handouts given.  Completed tox assure for drug screening.  Completed GAD-7.  Follow-up in 6 months.  Depression, recurrent ([place]) Patient is not currently on any antidepressant for the last 1 year.  Depression is worse.  Completed PHQ-9.  Patient completed GeneSight in the last 1 month.  I started patient on Pristiq 25 mg tablet by mouth daily.  Patient will follow-up in 6 weeks.  Education material provided.  Follow-up in 6 weeks.  Return in about 6 weeks (around 04/29/2022) for depression.   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     clindamycin (CLEOCIN T) 1 % external solution Apply topically 2 (two) times daily as needed.     clindamycin (CLEOCIN T) 1 % lotion Apply topically 2 (two) times daily as needed.     clonazePAM (KLONOPIN) 1 MG tablet Take 1 mg by mouth 3 (three) times daily.     ibuprofen (ADVIL) 800 MG tablet as needed.     metoprolol succinate (TOPROL-XL) 50 MG 24 hr tablet  Take 1 tablet (50 mg total) by mouth daily. Take with or immediately following a meal. 90 tablet 3   omeprazole (PRILOSEC) 40 MG capsule Take 40 mg by mouth daily.     ondansetron (ZOFRAN ODT) 8 MG disintegrating tablet Take 1 tablet (8 mg total) by mouth every 8 (eight) hours as needed for nausea or vomiting. 12 tablet 0   desvenlafaxine (PRISTIQ) 25 MG 24 hr tablet Take 1 tablet (25 mg total) by mouth daily. 30 tablet 2   No current facility-administered medications for this visit.   Allergies: Allergies  Allergen Reactions   Hydrocodone Nausea And Vomiting   Morphine And Related Nausea Only   Social History: Social History   Socioeconomic History   Marital status: Married    Spouse name: Not on file   Number of children: Not on file   Years of education: Not on file   Highest education level: Not on file  Occupational History   Not on file  Tobacco Use   Smoking status: Some Days    Packs/day: 0.25    Types: Cigarettes   Smokeless tobacco: Never  Substance and Sexual Activity   Alcohol use: No   Drug use: No   Sexual activity: Yes    Birth control/protection: Surgical  Other Topics Concern   Not on file  Social History Narrative   Not on file   Social Determinants of Health   Financial Resource Strain: Not on file  Food Insecurity: Not on file  Transportation Needs: Not on file  Physical Activity: Not on file  Stress: Not on file  Social Connections: Not on file       Family History: Family History  Problem Relation Age of Onset   Diabetes Mother    Heart disease Mother    Hyperlipidemia Mother          Review of Systems  HENT: Negative.    Eyes: Negative.   Respiratory: Negative.    Gastrointestinal: Negative.   Genitourinary: Negative.   Skin: Negative.   Psychiatric/Behavioral:  Positive for sleep disturbance. Negative for self-injury and suicidal ideas. The patient is nervous/anxious.        Depression  All other systems reviewed and are  negative.  Objective: BP 110/72   Pulse 91   Temp 98.6 F (37 C)   Ht 5\' 6"  (1.676 m)   Wt 176 lb 3.2 oz (79.9 kg)   SpO2 95%   BMI 28.44 kg/m  Body mass index is 28.44 kg/m. Physical Exam Vitals and nursing note reviewed.  Constitutional:      Appearance: Normal appearance.  HENT:     Head: Normocephalic.     Right Ear: External ear normal.     Left Ear: External ear normal.     Mouth/Throat:     Mouth: Mucous membranes are moist.     Pharynx: Oropharynx is clear.  Cardiovascular:     Rate and Rhythm: Normal rate and regular rhythm.     Pulses: Normal  pulses.     Heart sounds: Normal heart sounds.  Pulmonary:     Effort: Pulmonary effort is normal.     Breath sounds: Normal breath sounds.  Abdominal:     General: Bowel sounds are normal.  Neurological:     Mental Status: She is alert.    The plan was reviewed with the patient/family, and all questions/concerned were addressed.  It was my pleasure to see Sheila Schultz today and participate in her care. Please feel free to contact me with any questions or concerns.  Sincerely,  Jac Canavan NP [place]

## 2022-03-18 NOTE — Assessment & Plan Note (Signed)
Establishing care today with symptoms of depression and anxiety.  Currently on Klonopin which manages anxiety.  I provided education to patient on long-term use/safety and side effects of Klonopin. Patient will continue on the same medication and on the same dose as she has been on this medication since high school.  Printed handouts given.  Completed tox assure for drug screening.  Completed GAD-7.  Follow-up in 6 months.

## 2022-03-18 NOTE — Patient Instructions (Signed)
Generalized Anxiety Disorder, Adult Generalized anxiety disorder (GAD) is a mental health condition. Unlike normal worries, anxiety related to GAD is not triggered by a specific event. These worries do not fade or get better with time. GAD interferes with relationships, work, and school. GAD symptoms can vary from mild to severe. People with severe GAD can have intense waves of anxiety with physical symptoms that are similar to panic attacks. What are the causes? The exact cause of GAD is not known, but the following are believed to have an impact: Differences in natural brain chemicals. Genes passed down from parents to children. Differences in the way threats are perceived. Development and stress during childhood. Personality. What increases the risk? The following factors may make you more likely to develop this condition: Being female. Having a family history of anxiety disorders. Being very shy. Experiencing very stressful life events, such as the death of a loved one. Having a very stressful family environment. What are the signs or symptoms? People with GAD often worry excessively about many things in their lives, such as their health and family. Symptoms may also include: Mental and emotional symptoms: Worrying excessively about natural disasters. Fear of being late. Difficulty concentrating. Fears that others are judging your performance. Physical symptoms: Fatigue. Headaches, muscle tension, muscle twitches, trembling, or feeling shaky. Feeling like your heart is pounding or beating very fast. Feeling out of breath or like you cannot take a deep breath. Having trouble falling asleep or staying asleep, or experiencing restlessness. Sweating. Nausea, diarrhea, or irritable bowel syndrome (IBS). Behavioral symptoms: Experiencing erratic moods or irritability. Avoidance of new situations. Avoidance of people. Extreme difficulty making decisions. How is this diagnosed? This  condition is diagnosed based on your symptoms and medical history. You will also have a physical exam. Your health care provider may perform tests to rule out other possible causes of your symptoms. To be diagnosed with GAD, a person must have anxiety that: Is out of his or her control. Affects several different aspects of his or her life, such as work and relationships. Causes distress that makes him or her unable to take part in normal activities. Includes at least three symptoms of GAD, such as restlessness, fatigue, trouble concentrating, irritability, muscle tension, or sleep problems. Before your health care provider can confirm a diagnosis of GAD, these symptoms must be present more days than they are not, and they must last for 6 months or longer. How is this treated? This condition may be treated with: Medicine. Antidepressant medicine is usually prescribed for Certain-term daily control. Anti-anxiety medicines may be added in severe cases, especially when panic attacks occur. Talk therapy (psychotherapy). Certain types of talk therapy can be helpful in treating GAD by providing support, education, and guidance. Options include: Cognitive behavioral therapy (CBT). People learn coping skills and self-calming techniques to ease their physical symptoms. They learn to identify unrealistic thoughts and behaviors and to replace them with more appropriate thoughts and behaviors. Acceptance and commitment therapy (ACT). This treatment teaches people how to be mindful as a way to cope with unwanted thoughts and feelings. Biofeedback. This process trains you to manage your body's response (physiological response) through breathing techniques and relaxation methods. You will work with a therapist while machines are used to monitor your physical symptoms. Stress management techniques. These include yoga, meditation, and exercise. A mental health specialist can help determine which treatment is best for you.  Some people see improvement with one type of therapy. However, other people require   a combination of therapies. Follow these instructions at home: Lifestyle Maintain a consistent routine and schedule. Anticipate stressful situations. Create a plan and allow extra time to work with your plan. Practice stress management or self-calming techniques that you have learned from your therapist or your health care provider. Exercise regularly and spend time outdoors. Eat a healthy diet that includes plenty of vegetables, fruits, whole grains, low-fat dairy products, and lean protein. Do not eat a lot of foods that are high in fat, added sugar, or salt (sodium). Drink plenty of water. Avoid alcohol. Alcohol can increase anxiety. Avoid caffeine and certain over-the-counter cold medicines. These may make you feel worse. Ask your pharmacist which medicines to avoid. General instructions Take over-the-counter and prescription medicines only as told by your health care provider. Understand that you are likely to have setbacks. Accept this and be kind to yourself as you persist to take better care of yourself. Anticipate stressful situations. Create a plan and allow extra time to work with your plan. Recognize and accept your accomplishments, even if you judge them as small. Spend time with people who care about you. Keep all follow-up visits. This is important. Where to find more information National Institute of Mental Health: www.nimh.nih.gov Substance Abuse and Mental Health Services: www.samhsa.gov Contact a health care provider if: Your symptoms do not get better. Your symptoms get worse. You have signs of depression, such as: A persistently sad or irritable mood. Loss of enjoyment in activities that used to bring you joy. Change in weight or eating. Changes in sleeping habits. Get help right away if: You have thoughts about hurting yourself or others. If you ever feel like you may hurt  yourself or others, or have thoughts about taking your own life, get help right away. Go to your nearest emergency department or: Call your local emergency services (911 in the [place] Call a suicide crisis helpline, such as the National Suicide Prevention Lifeline at 1-800-273-8255 or 988 in the [place] This is open 24 hours a day in the [place]. Text the Crisis Text Line at 741741 (in the [place].). Summary Generalized anxiety disorder (GAD) is a mental health condition that involves worry that is not triggered by a specific event. People with GAD often worry excessively about many things in their lives, such as their health and family. GAD may cause symptoms such as restlessness, trouble concentrating, sleep problems, frequent sweating, nausea, diarrhea, headaches, and trembling or muscle twitching. A mental health specialist can help determine which treatment is best for you. Some people see improvement with one type of therapy. However, other people require a combination of therapies. This information is not intended to replace advice given to you by your health care provider. Make sure you discuss any questions you have with your health care provider. Document Revised: 12/27/2020 Document Reviewed: 09/24/2020 Elsevier Patient Education  2023 Elsevier Inc. Major Depressive Disorder, Adult Major depressive disorder is a mental health condition. This disorder affects feelings. It can also affect the body. Symptoms of this condition last most of the day, almost every day, for 2 weeks. This disorder can affect: Relationships. Daily activities, such as work and school. Activities that you normally like to do. What are the causes? The cause of this condition is not known. The disorder is likely caused by a mix of things, including: Your personality, such as being a shy person. Your behavior, or how you act toward others. Your thoughts and feelings. Too much alcohol or drugs. How you react to    stress. Health and mental problems that you have had for a long time. Things that hurt you in the past (trauma). Big changes in your life, such as divorce. What increases the risk? The following factors may make you more likely to develop this condition: Having family members with depression. Being a woman. Problems in the family. Low levels of some brain chemicals. Things that caused you pain as a child, especially if you lost a parent or were abused. A lot of stress in your life, such as from: Living without basic needs of life, such as food and shelter. Being treated poorly because of race, sex, or religion (discrimination). Health and mental problems that you have had for a long time. What are the signs or symptoms? The main symptoms of this condition are: Being sad all the time. Being grouchy all the time. Loss of interest in things and activities. Other symptoms include: Sleeping too much or too little. Eating too much or too little. Gaining or losing weight, without knowing why. Feeling tired or having low energy. Being restless and weak. Feeling hopeless, worthless, or guilty. Trouble thinking clearly or making decisions. Thoughts of hurting yourself or others, or thoughts of ending your life. Spending a lot of time alone. Inability to complete common tasks of daily life. If you have very bad MDD, you may: Believe things that are not true. Hear, see, taste, or feel things that are not there. Have mild depression that lasts for at least 2 years. Feel very sad and hopeless. Have trouble speaking or moving. How is this treated? This condition may be treated with: Talk therapy. This teaches you to know bad thoughts, feelings, and actions and how to change them. This can also help you to communicate with others. This can be done with members of your family. Medicines. These can be used to treat worry (anxiety), depression, or low levels of chemicals in the  brain. Lifestyle changes. You may need to: Limit alcohol use. Limit drug use. Get regular exercise. Get plenty of sleep. Make healthy eating choices. Spend more time outdoors. Brain stimulation. This treatment excites the brain. This is done when symptoms are very bad or have not gotten better with other treatments. Follow these instructions at home: Activity Get regular exercise as told. Spend time outdoors as told. Make time to do the things you enjoy. Find ways to deal with stress. Try to: Meditate. Do deep breathing. Spend time in nature. Keep a journal. Return to your normal activities as told by your doctor. Ask your doctor what activities are safe for you. Alcohol and drug use If you drink alcohol: Limit how much you use to: 0-1 drink a day for women. 0-2 drinks a day for men. Be aware of how much alcohol is in your drink. In the U.S., one drink equals one 12 oz bottle of beer (355 mL), one 5 oz glass of wine (148 mL), or one 1 oz glass of hard liquor (44 mL). Talk to your doctor about: Alcohol use. Alcohol can affect some medicines. Any drug use. General instructions  Take over-the-counter and prescription medicines and herbal preparations only as told by your doctor. Eat a healthy diet. Get a lot of sleep. Think about joining a support group. Your doctor may be able to suggest one. Keep all follow-up visits as told by your doctor. This is important. Where to find more information: [employer] on Mental Illness: www.nami.[place]: https://carter.com/ American Psychiatric Association: www.psychiatry.org/patients-families/ Contact  a doctor if: Your symptoms get worse. You get new symptoms. Get help right away if: You hurt yourself. You have serious thoughts about hurting yourself or others. You see, hear, taste, smell, or feel things that are not there. If you ever feel like you may hurt yourself or others, or have thoughts  about taking your own life, get help right away. Go to your nearest emergency department or: Call your local emergency services (911 in the [place].S.). Call a suicide crisis helpline, such as the [facility] at 434-118-6762 or 988 in the [place]. This is open 24 hours a day in the [place]. Text the Crisis Text Line at 904-001-1521 (in the [place].). Summary Major depressive disorder is a mental health condition. This disorder affects feelings. Symptoms of this condition last most of the day, almost every day, for 2 weeks. The symptoms of this disorder can cause problems with relationships and with daily activities. There are treatments and support for people who get this disorder. You may need more than one type of treatment. Get help right away if you have serious thoughts about hurting yourself or others. This information is not intended to replace advice given to you by your health care provider. Make sure you discuss any questions you have with your health care provider. Document Revised: 12/27/2020 Document Reviewed: 05/15/2019 Elsevier Patient Education  [place].

## 2022-03-18 NOTE — Assessment & Plan Note (Signed)
Patient is not currently on any antidepressant for the last 1 year.  Depression is worse.  Completed PHQ-9.  Patient completed GeneSight in the last 1 month.  I started patient on Pristiq 25 mg tablet by mouth daily.  Patient will follow-up in 6 weeks.  Education material provided.  Follow-up in 6 weeks.

## 2022-03-20 LAB — TOXASSURE SELECT 13 (MW), URINE

## 2022-03-20 NOTE — Telephone Encounter (Signed)
Pt called to let PCP know that insurance denied the PA for her medicine. Needs to know what next steps are?

## 2022-03-21 ENCOUNTER — Other Ambulatory Visit: Payer: Self-pay | Admitting: Nurse Practitioner

## 2022-03-21 NOTE — Telephone Encounter (Signed)
Call back on PA to get status

## 2022-03-22 NOTE — Telephone Encounter (Signed)
APPROVED  Faxed approval letter to [place] patient and notified

## 2022-04-03 ENCOUNTER — Telehealth: Payer: 59 | Admitting: Family

## 2022-04-03 ENCOUNTER — Encounter: Payer: Self-pay | Admitting: Nurse Practitioner

## 2022-04-03 DIAGNOSIS — R197 Diarrhea, unspecified: Secondary | ICD-10-CM

## 2022-04-03 NOTE — Progress Notes (Signed)
Given your symptoms of diarrhea and abdominal pain, it would be best that you be seen in a face to face visit.   NOTE: There will be NO CHARGE for this eVisit   If you are having a true medical emergency please call 911.      For an urgent face to face visit, [facility] has seven urgent care centers for your convenience:     [facility] [place] [facility] [place] Get Driving Directions [phone] [address] [place], [place] [address]    [facility]) Get Driving Directions [phone] [place], [place] [address]  [facility] [place] ([place]) Get Driving Directions [phone] [address] [place],  [place]  [address]  [facility] [place] [facility] - at [address] Get Driving Directions  [phone] [address] [place],  [place] [address]   [facility] Get Driving Directions [phone] [place] [address], [place], [place] [address]   [facility] Get Driving Directions  [phone] [address], [place] [address]   [facility] [place] Get Driving Directions [phone] [address] [place], [place] [address]  Your MyChart E-visit questionnaire answers were reviewed by a board certified advanced clinical practitioner to complete your personal care plan based on your specific symptoms.  Thank you for using e-Visits.

## 2022-04-11 ENCOUNTER — Encounter: Payer: Self-pay | Admitting: Nurse Practitioner

## 2022-04-12 NOTE — Telephone Encounter (Signed)
Please schedule OV for hemorrhoids  to be assessed for prescription medication to be sent.

## 2022-04-29 ENCOUNTER — Ambulatory Visit: Payer: 59 | Admitting: Nurse Practitioner

## 2022-04-30 ENCOUNTER — Encounter: Payer: Self-pay | Admitting: Family Medicine

## 2022-04-30 ENCOUNTER — Other Ambulatory Visit: Payer: Self-pay | Admitting: Nurse Practitioner

## 2022-04-30 ENCOUNTER — Telehealth (INDEPENDENT_AMBULATORY_CARE_PROVIDER_SITE_OTHER): Payer: 59 | Admitting: Family Medicine

## 2022-04-30 DIAGNOSIS — K644 Residual hemorrhoidal skin tags: Secondary | ICD-10-CM

## 2022-04-30 MED ORDER — HYDROCORTISONE (PERIANAL) 2.5 % EX CREA: 1.0000 | TOPICAL_CREAM | Freq: Two times a day (BID) | CUTANEOUS | 0 refills | Status: AC

## 2022-04-30 NOTE — Progress Notes (Signed)
Virtual Visit via telephone Note Due to COVID-19 pandemic this visit was conducted virtually. This visit type was conducted due to national recommendations for restrictions regarding the COVID-19 Pandemic (e.g. social distancing, sheltering in place) in an effort to limit this patient's exposure and mitigate transmission in our community. All issues noted in this document were discussed and addressed.  A physical exam was not performed with this format.   I connected with Sheila Schultz on 04/30/2022 at 1615 by telephone and verified that I am speaking with the correct person using two identifiers. Sheila Schultz is currently located at home and patient is currently with them during visit. The provider, Kari Baars, FNP is located in their office at time of visit.  I discussed the limitations, risks, security and privacy concerns of performing an evaluation and management service by virtual visit and the availability of in person appointments. I also discussed with the patient that there may be a patient responsible charge related to this service. The patient expressed understanding and agreed to proceed.  Subjective:  Patient ID: Sheila Schultz, female    DOB: [birth year], 46 y.o.   MRN: [id number]  Chief Complaint:  Hemorrhoids   HPI: Sheila Schultz is a 46 y.o. female presenting on 04/30/2022 for Hemorrhoids   Pt reports worsening external hemorrhoids. She does follow a keto diet and does strain daily to have a BM. States stools are usually hard. She has not been taking a stool softener. Does drink plenty of water. Has tried over the counter treatments without relief of symptoms. States bright red blood with wiping. No blood is commode. No other associated symptoms. States they are external.      Relevant past medical, surgical, family, and social history reviewed and updated as indicated.  Allergies and medications reviewed and updated.   Past Medical History:  Diagnosis Date    Anemia    Anxiety    Foot pain, left    Gall stones    Palpitations    Renal disorder     Past Surgical History:  Procedure Laterality Date   ABLATION     TONSILLECTOMY     TUBAL LIGATION      Social History   Socioeconomic History   Marital status: Married    Spouse name: Not on file   Number of children: Not on file   Years of education: Not on file   Highest education level: Not on file  Occupational History   Not on file  Tobacco Use   Smoking status: Some Days    Packs/day: 0.25    Types: Cigarettes   Smokeless tobacco: Never  Substance and Sexual Activity   Alcohol use: No   Drug use: No   Sexual activity: Yes    Birth control/protection: Surgical  Other Topics Concern   Not on file  Social History Narrative   Not on file   Social Determinants of Health   Financial Resource Strain: Not on file  Food Insecurity: Not on file  Transportation Needs: Not on file  Physical Activity: Not on file  Stress: Not on file  Social Connections: Not on file  Intimate Partner Violence: Not on file    Outpatient Encounter Medications as of 04/30/2022  Medication Sig   hydrocortisone (ANUSOL-HC) 2.5 % rectal cream Place 1 Application rectally 2 (two) times daily for 5 days.   albuterol (VENTOLIN HFA) 108 (90 Base) MCG/ACT inhaler Inhale 2 puffs into the lungs every 6 (six) hours as needed.   clindamycin (  CLEOCIN T) 1 % external solution Apply topically 2 (two) times daily as needed.   clindamycin (CLEOCIN T) 1 % lotion Apply topically 2 (two) times daily as needed.   clonazePAM (KLONOPIN) 1 MG tablet TAKE 1 TABLET 3 TIMES A DAY   desvenlafaxine (PRISTIQ) 25 MG 24 hr tablet Take 1 tablet (25 mg total) by mouth daily.   ibuprofen (ADVIL) 800 MG tablet as needed.   metoprolol succinate (TOPROL-XL) 25 MG 24 hr tablet TAKE 1 TABLET DAILY   metoprolol succinate (TOPROL-XL) 50 MG 24 hr tablet Take 1 tablet (50 mg total) by mouth daily. Take with or immediately following a  meal.   omeprazole (PRILOSEC) 40 MG capsule Take 40 mg by mouth daily.   ondansetron (ZOFRAN ODT) 8 MG disintegrating tablet Take 1 tablet (8 mg total) by mouth every 8 (eight) hours as needed for nausea or vomiting.   No facility-administered encounter medications on file as of 04/30/2022.    Allergies  Allergen Reactions   Hydrocodone Nausea And Vomiting   Morphine And Related Nausea Only    Review of Systems  Constitutional:  Negative for activity change, appetite change, chills, diaphoresis, fatigue, fever and unexpected weight change.  Respiratory:  Negative for cough and shortness of breath.   Cardiovascular:  Negative for chest pain, palpitations and leg swelling.  Gastrointestinal:  Positive for anal bleeding, constipation and rectal pain. Negative for abdominal distention, abdominal pain, blood in stool, diarrhea, nausea and vomiting.  Genitourinary:  Negative for decreased urine volume.  Neurological:  Negative for weakness.  Psychiatric/Behavioral:  Negative for confusion.   All other systems reviewed and are negative.        Observations/Objective: No vital signs or physical exam, this was a virtual health encounter.  Pt alert and oriented, answers all questions appropriately, and able to speak in full sentences.    Assessment and Plan: Ena was seen today for hemorrhoids.  Diagnoses and all orders for this visit:  External hemorrhoids Discussed increasing fiber intake in diet with supplements and add Miralx daily to help soften stool. Will burst with Anusol. If this is not beneficial, will refer to GI.  -     hydrocortisone (ANUSOL-HC) 2.5 % rectal cream; Place 1 Application rectally 2 (two) times daily for 5 days.     Follow Up Instructions: Return if symptoms worsen or fail to improve.    I discussed the assessment and treatment plan with the patient. The patient was provided an opportunity to ask questions and all were answered. The patient agreed  with the plan and demonstrated an understanding of the instructions.   The patient was advised to call back or seek an in-person evaluation if the symptoms worsen or if the condition fails to improve as anticipated.  The above assessment and management plan was discussed with the patient. The patient verbalized understanding of and has agreed to the management plan. Patient is aware to call the clinic if they develop any new symptoms or if symptoms persist or worsen. Patient is aware when to return to the clinic for a follow-up visit. Patient educated on when it is appropriate to go to the emergency department.    I provided 13 minutes of time during this telephone encounter.   Kari Baars, FNP-C [facility] [address] [place], [place] [address] [phone] 04/30/2022

## 2022-05-03 ENCOUNTER — Ambulatory Visit (INDEPENDENT_AMBULATORY_CARE_PROVIDER_SITE_OTHER): Payer: 59 | Admitting: Nurse Practitioner

## 2022-05-03 ENCOUNTER — Encounter: Payer: Self-pay | Admitting: Nurse Practitioner

## 2022-05-03 VITALS — BP 99/65 | HR 69 | Temp 98.4°F | Ht 66.0 in | Wt 158.0 lb

## 2022-05-03 DIAGNOSIS — F411 Generalized anxiety disorder: Secondary | ICD-10-CM

## 2022-05-03 DIAGNOSIS — R110 Nausea: Secondary | ICD-10-CM

## 2022-05-03 DIAGNOSIS — R69 Illness, unspecified: Secondary | ICD-10-CM | POA: Diagnosis not present

## 2022-05-03 DIAGNOSIS — F339 Major depressive disorder, recurrent, unspecified: Secondary | ICD-10-CM

## 2022-05-03 MED ORDER — ONDANSETRON HCL 4 MG PO TABS: 4.0000 mg | ORAL_TABLET | Freq: Three times a day (TID) | ORAL | 0 refills | Status: DC | PRN

## 2022-05-03 MED ORDER — CLONAZEPAM 1 MG PO TABS: 1.0000 mg | ORAL_TABLET | Freq: Three times a day (TID) | ORAL | 0 refills | Status: DC

## 2022-05-03 NOTE — Assessment & Plan Note (Signed)
Patient discontinued Pristiq 25 mg tablet by mouth daily.  Although medication was helping to improve symptoms.  Patient at this time would prefer to mentally help herself by exercising, cognitive behavioral therapy, putting herself first, and looking at the right side alive.  I provided education to patient on the importance of depression management and control.  Patient knows to follow-up if symptoms become worse or unresolved.  PHQ completed.

## 2022-05-03 NOTE — Assessment & Plan Note (Signed)
Anxiety well controlled on Klonopin 1 mg tablet by mouth twice daily.  Completed GAD-7.  Rx refill sent to pharmacy.  Follow-up in 6 months.

## 2022-05-03 NOTE — Progress Notes (Signed)
Established Patient Office Visit  Subjective   Patient ID: Marcena Dias, female    DOB: April 11, 1976  Age: 46 y.o. MRN: [id number]  Chief Complaint  Patient presents with   Depression    HPI Depression, Follow-up  She  was last seen for this 6 weeks ago. Changes made at last visit include Pristiq 25 mg tablet by mouth daily..   She reports good compliance with treatment. She is not having side effects.   She reports good tolerance of treatment. Current symptoms include: depressed mood, difficulty concentrating, fatigue, and insomnia She feels she is Improved since last visit.     05/03/2022   12:25 PM 03/18/2022   11:59 AM 10/21/2016    9:04 AM  Depression screen PHQ 2/9  Decreased Interest 1 2 0  Down, Depressed, Hopeless 2 3 0  PHQ - 2 Score 3 5 0  Altered sleeping 3 3   Tired, decreased energy 0 1   Change in appetite 1 3   Feeling bad or failure about yourself  3 3   Trouble concentrating 3 3   Moving slowly or fidgety/restless 0 3   Suicidal thoughts 0 1   PHQ-9 Score 13 22   Difficult doing work/chores Not difficult at all Somewhat difficult     Anxiety, Follow-up  She was last seen for anxiety 6 weeks ago. Changes made at last visit include Klonopin 1 mg tablet by mouth 3 times daily..   She reports good compliance with treatment. She reports good tolerance of treatment. She is not having side effects.   She feels her anxiety is moderate and Improved since last visit.  Symptoms: No chest pain No difficulty concentrating  No dizziness No fatigue  Yes feelings of losing control Yes insomnia  Yes irritable No palpitations  No panic attacks No racing thoughts  No shortness of breath No sweating  No tremors/shakes    GAD-7 Results    05/03/2022   12:27 PM 03/18/2022   12:00 PM 08/15/2016    1:20 PM  GAD-7 Generalized Anxiety Disorder Screening Tool  1. Feeling Nervous, Anxious, or on Edge 3 1 2   2. Not Being Able to Stop or Control Worrying 3 3 3    3. Worrying Too Much About Different Things 3 3 3   4. Trouble Relaxing 3 3 3   5. Being So Restless it's Hard To Sit Still 3 3 3   6. Becoming Easily Annoyed or Irritable 3 3 3   7. Feeling Afraid As If Something Awful Might Happen 0 1 2  Total GAD-7 Score 18 17 19   Difficulty At Work, Home, or Getting  Along With Others? 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   HENT: Negative.    Eyes: Negative.   Respiratory: Negative.    Cardiovascular: Negative.   Musculoskeletal: Negative.   Skin: Negative.  Negative for itching and rash.  Neurological: Negative.   Psychiatric/Behavioral:  Positive for depression. The patient is nervous/anxious.   All other systems reviewed and are negative.     Objective:     BP 99/65   Pulse 69   Temp 98.4 F (36.9 C) (Temporal)   Ht 5\' 6"  (1.676 m)   Wt 158 lb (71.7 kg)   SpO2 97%   BMI 25.50 kg/m  BP Readings from Last 3 Encounters:  05/03/22 99/65  03/18/22 110/72   08/03/21 140/80   Wt Readings from Last 3 Encounters:  05/03/22 158 lb (71.7 kg)  03/18/22 176 lb 3.2 oz (79.9 kg)  08/03/21 202 lb (91.6 kg)      Physical Exam Vitals and nursing note reviewed. Exam conducted with a chaperone present.  HENT:     Head: Normocephalic.     Right Ear: External ear normal.     Left Ear: External ear normal.     Nose: Nose normal.  Eyes:     Conjunctiva/sclera: Conjunctivae normal.  Cardiovascular:     Rate and Rhythm: Normal rate and regular rhythm.     Pulses: Normal pulses.     Heart sounds: Normal heart sounds.  Pulmonary:     Effort: Pulmonary effort is normal.     Breath sounds: Normal breath sounds.  Skin:    General: Skin is warm.     Findings: No erythema or rash.  Neurological:     General: No focal deficit present.     Mental Status: She is alert and oriented to person, place, and time.  Psychiatric:        Attention and Perception: Attention and perception normal.        Mood and Affect: Mood is anxious and depressed.        Speech: Speech normal.        Behavior: Behavior normal.        Thought Content: Thought content normal.      No results found for any visits on 05/03/22.  Last CBC Lab Results  Component Value Date   WBC 10.1 10/07/2016   HGB 14.0 10/07/2016   HCT 42.6 10/07/2016   MCV 92.6 10/07/2016   MCH 30.4 10/07/2016   RDW 13.3 10/07/2016   PLT 332 10/07/2016   Last metabolic panel Lab Results  Component Value Date   GLUCOSE 110 (H) 10/07/2016   NA 136 10/07/2016   K 4.4 10/07/2016   CL 103 10/07/2016   CO2 27 10/07/2016   BUN 9 10/07/2016   CREATININE 0.89 10/07/2016   GFRNONAA >60 10/07/2016   CALCIUM 9.3 10/07/2016   PROT 7.7 10/07/2016   ALBUMIN 4.0 10/07/2016   LABGLOB 2.5 08/19/2016   AGRATIO 1.7 08/19/2016   BILITOT 0.4 10/07/2016   ALKPHOS 66 10/07/2016   AST 15 10/07/2016   ALT 16 10/07/2016   ANIONGAP 6 10/07/2016      The ASCVD Risk score (Arnett DK, et al., 2019) failed to  calculate for  the following reasons:   Cannot find a previous HDL lab   Cannot find a previous total cholesterol lab    Assessment & Plan:   Problem List Items Addressed This Visit       Other   GAD (generalized anxiety disorder) - Primary    Anxiety well controlled on Klonopin 1 mg tablet by mouth twice daily.  Completed GAD-7.  Rx refill sent to pharmacy.  Follow-up in 6 months.      Relevant Medications   clonazePAM (KLONOPIN) 1 MG tablet   Depression, recurrent (HCC)    Patient discontinued Pristiq 25 mg tablet by mouth daily.  Although medication was helping to improve symptoms.  Patient at this time would prefer to mentally help herself by exercising, cognitive behavioral therapy, putting herself first, and looking at the right side alive.  I provided education to patient on the importance of depression management and control.  Patient knows to follow-up if symptoms become worse or unresolved.  PHQ completed.      Other Visit Diagnoses     Nausea       Relevant Medications   ondansetron (ZOFRAN) 4 MG tablet       Return in about 6 months (around 11/01/2022).    Daryll Drown, NP

## 2022-05-04 ENCOUNTER — Encounter: Payer: Self-pay | Admitting: Nurse Practitioner

## 2022-05-20 ENCOUNTER — Telehealth: Payer: 59 | Admitting: Physician Assistant

## 2022-05-20 DIAGNOSIS — R6889 Other general symptoms and signs: Secondary | ICD-10-CM | POA: Diagnosis not present

## 2022-05-20 MED ORDER — NAPROXEN 500 MG PO TABS: 500.0000 mg | ORAL_TABLET | Freq: Two times a day (BID) | ORAL | 0 refills | Status: DC

## 2022-05-20 MED ORDER — BENZONATATE 100 MG PO CAPS: 100.0000 mg | ORAL_CAPSULE | Freq: Three times a day (TID) | ORAL | 0 refills | Status: DC | PRN

## 2022-05-20 MED ORDER — PROMETHAZINE-DM 6.25-15 MG/5ML PO SYRP: 5.0000 mL | ORAL_SOLUTION | Freq: Four times a day (QID) | ORAL | 0 refills | Status: DC | PRN

## 2022-05-20 NOTE — Progress Notes (Signed)
E visit for Flu like symptoms   We are sorry that you are not feeling well.  Here is how we plan to help! Based on what you have shared with me it looks like you may have flu-like symptoms that should be watched but do not seem to indicate anti-viral treatment.  Influenza or "the flu" is   an infection caused by a respiratory virus. The flu virus is highly contagious and persons who did not receive their yearly flu vaccination may "catch" the flu from close contact.  We have anti-viral medications to treat the viruses that cause this infection. They are not a "cure" and only shorten the course of the infection. These prescriptions are most effective when they are given within the first 2 days of "flu" symptoms. Antiviral medication are indicated if you have a high risk of complications from the flu. You should  also consider an antiviral medication if you are in close contact with someone who is at risk. These medications can help patients avoid complications from the flu  but have side effects that you should know. Possible side effects from Tamiflu or oseltamivir include nausea, vomiting, diarrhea, dizziness, headaches, eye redness, sleep problems or other respiratory symptoms. You should not take Tamiflu if you have an allergy to oseltamivir or any to the ingredients in Tamiflu.  Based upon your symptoms and potential risk factors I recommend that you follow the flu symptoms recommendation that I have listed below.  I have prescribed Naprosyn 500mg  Take 1 tablet twice daily as needed for pain/body aches; Tessalon perles Take 1 capsule every 8 hours as needed for cough; Promethazine DM Take 67mL every 6 hours as needed for cough.   ANYONE WHO HAS FLU SYMPTOMS SHOULD: Stay home. The flu is highly contagious and going out or to work exposes others! Be sure to drink plenty of fluids. Water is fine as well as fruit juices, sodas and electrolyte beverages. You may want to stay away from caffeine or  alcohol. If you are nauseated, try taking small sips of liquids. How do you know if you are getting enough fluid? Your urine should be a pale yellow or almost colorless. Get rest. Taking a steamy shower or using a humidifier may help nasal congestion and ease sore throat pain. Using a saline nasal spray works much the same way. Cough drops, hard candies and sore throat lozenges may ease your cough. Line up a caregiver. Have someone check on you regularly.   GET HELP RIGHT AWAY IF: You cannot keep down liquids or your medications. You become short of breath Your fell like you are going to pass out or loose consciousness. Your symptoms persist after you have completed your treatment plan MAKE SURE YOU  Understand these instructions. Will watch your condition. Will get help right away if you are not doing well or get worse.  Your e-visit answers were reviewed by a board certified advanced clinical practitioner to complete your personal care plan.  Depending on the condition, your plan could have included both over the counter or prescription medications.  If there is a problem please reply  once you have received a response from your provider.  Your safety is important to 4m.  If you have drug allergies check your prescription carefully.    You can use MyChart to ask questions about today's visit, request a non-urgent call back, or ask for a work or school excuse for 24 hours related to this e-Visit. If it has been greater  than 24 hours you will need to follow up with your provider, or enter a new e-Visit to address those concerns.  You will get an e-mail in the next two days asking about your experience.  I hope that your e-visit has been valuable and will speed your recovery. Thank you for using e-visits.  I have spent 5 minutes in review of e-visit questionnaire, review and updating patient chart, medical decision making and response to patient.   Margaretann Loveless, PA-C

## 2022-05-28 ENCOUNTER — Encounter: Payer: Self-pay | Admitting: Nurse Practitioner

## 2022-05-28 ENCOUNTER — Other Ambulatory Visit: Payer: Self-pay

## 2022-05-28 ENCOUNTER — Other Ambulatory Visit: Payer: Self-pay | Admitting: Nurse Practitioner

## 2022-05-28 MED ORDER — OMEPRAZOLE 40 MG PO CPDR
40.0000 mg | DELAYED_RELEASE_CAPSULE | Freq: Every day | ORAL | 1 refills | Status: DC
  Filled 2022-05-28: qty 90, 90d supply, fill #0

## 2022-05-28 MED ORDER — OMEPRAZOLE 40 MG PO CPDR: 40.0000 mg | DELAYED_RELEASE_CAPSULE | Freq: Every day | ORAL | 1 refills | Status: AC

## 2022-06-24 ENCOUNTER — Telehealth: Payer: Self-pay | Admitting: Nurse Practitioner

## 2022-06-24 DIAGNOSIS — F411 Generalized anxiety disorder: Secondary | ICD-10-CM

## 2022-06-25 ENCOUNTER — Other Ambulatory Visit: Payer: Self-pay | Admitting: Nurse Practitioner

## 2022-06-25 NOTE — Telephone Encounter (Signed)
Patient will need Urine drug screen for refill

## 2022-06-26 ENCOUNTER — Other Ambulatory Visit: Payer: Self-pay | Admitting: Nurse Practitioner

## 2022-06-26 DIAGNOSIS — F411 Generalized anxiety disorder: Secondary | ICD-10-CM

## 2022-06-26 MED ORDER — CLONAZEPAM 1 MG PO TABS: 1.0000 mg | ORAL_TABLET | Freq: Three times a day (TID) | ORAL | 0 refills | Status: DC

## 2022-06-26 NOTE — Telephone Encounter (Signed)
Sorry my oversight. I sent refill to pharmacy. Have patient schedule with new PCP and she may want to find out if new PCP will continue to prescribe medication. Thank you

## 2022-06-26 NOTE — Telephone Encounter (Signed)
Spoke with pt , advised her of her medication sent in , got her 3 month apt moved to April with new PCP

## 2022-06-26 NOTE — Telephone Encounter (Signed)
Pt called stating that she was seen for her last regular check up in November and was told that she did not need to come back until May so she doesn't understand why she has to have another appt to get this Rx refilled when she is not due for appt.

## 2022-06-28 ENCOUNTER — Ambulatory Visit: Payer: 59 | Admitting: Nurse Practitioner

## 2022-07-04 ENCOUNTER — Encounter: Payer: Self-pay | Admitting: Family Medicine

## 2022-07-12 ENCOUNTER — Ambulatory Visit: Payer: Self-pay | Admitting: Family Medicine

## 2022-07-15 ENCOUNTER — Telehealth: Payer: Self-pay | Admitting: Family Medicine

## 2022-07-15 ENCOUNTER — Ambulatory Visit (INDEPENDENT_AMBULATORY_CARE_PROVIDER_SITE_OTHER): Payer: 59 | Admitting: Family Medicine

## 2022-07-15 ENCOUNTER — Encounter: Payer: Self-pay | Admitting: Family Medicine

## 2022-07-15 VITALS — BP 100/55 | HR 97 | Temp 97.4°F | Ht 66.0 in | Wt 142.5 lb

## 2022-07-15 DIAGNOSIS — F1320 Sedative, hypnotic or anxiolytic dependence, uncomplicated: Secondary | ICD-10-CM | POA: Diagnosis not present

## 2022-07-15 DIAGNOSIS — F411 Generalized anxiety disorder: Secondary | ICD-10-CM

## 2022-07-15 DIAGNOSIS — F339 Major depressive disorder, recurrent, unspecified: Secondary | ICD-10-CM | POA: Diagnosis not present

## 2022-07-15 DIAGNOSIS — F5104 Psychophysiologic insomnia: Secondary | ICD-10-CM

## 2022-07-15 DIAGNOSIS — H1031 Unspecified acute conjunctivitis, right eye: Secondary | ICD-10-CM

## 2022-07-15 MED ORDER — BUPROPION HCL ER (XL) 150 MG PO TB24: 150.0000 mg | ORAL_TABLET | Freq: Every day | ORAL | 1 refills | Status: DC

## 2022-07-15 MED ORDER — OFLOXACIN 0.3 % OP SOLN: 1.0000 [drp] | Freq: Four times a day (QID) | OPHTHALMIC | 0 refills | Status: AC

## 2022-07-15 MED ORDER — BUSPIRONE HCL 5 MG PO TABS: 5.0000 mg | ORAL_TABLET | Freq: Two times a day (BID) | ORAL | 1 refills | Status: DC

## 2022-07-15 NOTE — Telephone Encounter (Signed)
Patient states that [facility] Rx will have tomorrow.

## 2022-07-15 NOTE — Telephone Encounter (Signed)
[facility] has the medication and is going to call [facility] to get RX transferred to them.

## 2022-07-15 NOTE — Progress Notes (Signed)
Established Patient Office Visit  Subjective   Patient ID: Sheila Schultz, female    DOB: [birth year]  Age: 47 y.o. MRN: [id number]  Chief Complaint  Patient presents with   Insomnia    Insomnia   Insomnia Issues intermittently over many years. Insomnia has been much worse over the last 2 months. She has not more stress lately. She is able to fall sleep quickly but wakes after 2 hours or so and is then unable to get back to sleep due to her anxiety. She has tried and failed OTC medications and ambien in the past.   2.  Anxiety/depression Reports uncontrolled and debilitating currently. She is on Klonopin TID. She reports she has been on this since she was a teenager. This does help to reduce panic attacks. She has tried SSRIs and SNRIs in the past but has had side effects with the ones she tried: pristiq, lexapro, prozac, clexea, and zoloft. Because of this, she has tried to manage her symtpoms on her own. She is not currently interested in a referral for counseling. She has completed genesight testing and has a copy of the results on her phone today. She is interested in trying wellbutrin.      07/15/2022    9:05 AM 05/03/2022   12:25 PM 03/18/2022   11:59 AM  Depression screen PHQ 2/9  Decreased Interest 1 1 2   Down, Depressed, Hopeless 2 2 3   PHQ - 2 Score 3 3 5   Altered sleeping 1 3 3   Tired, decreased energy 3 0 1  Change in appetite 3 1 3   Feeling bad or failure about yourself  3 3 3   Trouble concentrating 3 3 3   Moving slowly or fidgety/restless 3 0 3  Suicidal thoughts 0 0 1  PHQ-9 Score 19 13 22   Difficult doing work/chores Very difficult Not difficult at all Somewhat difficult      07/15/2022    9:05 AM 05/03/2022   12:27 PM 03/18/2022   12:00 PM 08/15/2016    1:20 PM  GAD 7 : Generalized Anxiety Score  Nervous, Anxious, on Edge 3 3 1 2   Control/stop worrying 3 3 3 3   Worry too much - different things 3 3 3 3   Trouble relaxing 3 3 3 3   Restless 3 3 3 3   Easily  annoyed or irritable 1 3 3 3   Afraid - awful might happen 1 0 1 2  Total GAD 7 Score 17 18 17 19   Anxiety Difficulty Very difficult Very difficult  Not difficult at all   3. Eye problem She woke up with right eye matted shut with yellow drainage. She also reports some discomfort and sensitivity to light. She has had close exposure to pink eye. Denies fever, swelling, erythema or changes in vision.     Review of Systems  Psychiatric/Behavioral:  The patient has insomnia.    As per HPI.   Objective:     BP (!) 100/55   Pulse 97   Temp (!) 97.4 F (36.3 C) (Temporal)   Ht 5\' 6"  (1.676 m)   Wt 142 lb 8 oz (64.6 kg)   SpO2 98%   BMI 23.00 kg/m  Wt Readings from Last 3 Encounters:  07/15/22 142 lb 8 oz (64.6 kg)  05/03/22 158 lb (71.7 kg)  03/18/22 176 lb 3.2 oz (79.9 kg)      Physical Exam Vitals and nursing note reviewed.  Constitutional:      General: She is not in acute  distress.    Appearance: She is not ill-appearing, toxic-appearing or diaphoretic.  Eyes:     General: Lids are normal.        Right eye: No discharge.        Left eye: No discharge.     Extraocular Movements: Extraocular movements intact.     Conjunctiva/sclera:     Right eye: Right conjunctiva is injected. No exudate.    Left eye: Left conjunctiva is not injected. No exudate. Cardiovascular:     Rate and Rhythm: Normal rate and regular rhythm.     Heart sounds: Normal heart sounds. No murmur heard. Pulmonary:     Effort: Pulmonary effort is normal. No respiratory distress.     Breath sounds: Normal breath sounds.  Musculoskeletal:     Right lower leg: No edema.     Left lower leg: No edema.  Skin:    General: Skin is warm and dry.  Neurological:     General: No focal deficit present.     Mental Status: She is alert and oriented to person, place, and time.  Psychiatric:        Mood and Affect: Mood normal.        Behavior: Behavior normal.      No results found for any visits on  07/15/22.    The ASCVD Risk score (Arnett DK, et al., 2019) failed to calculate for the following reasons:   Cannot find a previous HDL lab   Cannot find a previous total cholesterol lab    Assessment & Plan:   Sheila Schultz was seen today for insomnia.  Diagnoses and all orders for this visit:  Psychophysiological insomnia Uncontrolled. Discussed need for better control of anxiety and depression as this is the cause of her insomnia. Discussed sleep hygiene and OTC medications while working to control anxiety/depression.   GAD (generalized anxiety disorder) Uncontrolled. Start buspar as below.  -     busPIRone (BUSPAR) 5 MG tablet; Take 1 tablet (5 mg total) by mouth 2 (two) times daily.  Depression, recurrent (Sheila Schultz) Uncontrolled. Denies SI, plan, or intent. Start wellbutrin as below.  -     buPROPion (WELLBUTRIN XL) 150 MG 24 hr tablet; Take 1 tablet (150 mg total) by mouth daily.  Benzodiazepine dependence (Sheila Schultz) She has been on klonopin TID for a significant number of years for treatment of anxiety. UDS is UTD. PDMP reviewed, no red flags. Will update CSA at follow up appointment when refills are due.   Acute bacterial conjunctivitis of right eye Ofloxacin as below.  -     ofloxacin (OCUFLOX) 0.3 % ophthalmic solution; Place 1 drop into the right eye 4 (four) times daily for 7 days.   Return in about 6 weeks (around 08/26/2022) for medication.   The patient indicates understanding of these issues and agrees with the plan.  Gwenlyn Perking, FNP

## 2022-07-19 ENCOUNTER — Encounter: Payer: Self-pay | Admitting: Family Medicine

## 2022-07-26 ENCOUNTER — Other Ambulatory Visit: Payer: Self-pay | Admitting: Family Medicine

## 2022-07-26 ENCOUNTER — Other Ambulatory Visit: Payer: Self-pay | Admitting: *Deleted

## 2022-07-26 DIAGNOSIS — F411 Generalized anxiety disorder: Secondary | ICD-10-CM

## 2022-07-26 DIAGNOSIS — F339 Major depressive disorder, recurrent, unspecified: Secondary | ICD-10-CM

## 2022-07-26 DIAGNOSIS — F410 Panic disorder [episodic paroxysmal anxiety] without agoraphobia: Secondary | ICD-10-CM

## 2022-07-26 MED ORDER — CLONAZEPAM 1 MG PO TABS: 1.0000 mg | ORAL_TABLET | Freq: Three times a day (TID) | ORAL | 0 refills | Status: DC

## 2022-07-26 NOTE — Telephone Encounter (Signed)
Fax from [facility] for Clonazepam 1 mg Please see TC on 06/24/22 Last visit w/ new PCP 07/15/22 RTC 6 wks Next OV 08/26/22

## 2022-08-01 ENCOUNTER — Encounter: Payer: Self-pay | Admitting: Family

## 2022-08-01 ENCOUNTER — Telehealth (INDEPENDENT_AMBULATORY_CARE_PROVIDER_SITE_OTHER): Payer: 59 | Admitting: Family

## 2022-08-01 ENCOUNTER — Encounter: Payer: Self-pay | Admitting: Family Medicine

## 2022-08-01 DIAGNOSIS — B3731 Acute candidiasis of vulva and vagina: Secondary | ICD-10-CM | POA: Diagnosis not present

## 2022-08-01 MED ORDER — FLUCONAZOLE 150 MG PO TABS: 150.0000 mg | ORAL_TABLET | ORAL | 0 refills | Status: DC | PRN

## 2022-08-01 NOTE — Progress Notes (Signed)
Virtual Visit Consent   Anessia Jaffe, you are scheduled for a virtual visit with a [facility] provider today. Just as with appointments in the office, your consent must be obtained to participate. Your consent will be active for this visit and any virtual visit you may have with one of our providers in the next 365 days. If you have a MyChart account, a copy of this consent can be sent to you electronically.  As this is a virtual visit, video technology does not allow for your provider to perform a traditional examination. This may limit your provider's ability to fully assess your condition. If your provider identifies any concerns that need to be evaluated in person or the need to arrange testing (such as labs, EKG, etc.), we will make arrangements to do so. Although advances in technology are sophisticated, we cannot ensure that it will always work on either your end or our end. If the connection with a video visit is poor, the visit may have to be switched to a telephone visit. With either a video or telephone visit, we are not always able to ensure that we have a secure connection.  By engaging in this virtual visit, you consent to the provision of healthcare and authorize for your insurance to be billed (if applicable) for the services provided during this visit. Depending on your insurance coverage, you may receive a charge related to this service.  I need to obtain your verbal consent now. Are you willing to proceed with your visit today? Sheila Schultz has provided verbal consent on 08/01/2022 for a virtual visit (video or telephone). Evelina Dun, FNP  Date: 08/01/2022 12:23 PM  Virtual Visit via Video Note   I, Evelina Dun, connected with  Sheila Schultz  ([id number], 1975-12-21) on 08/01/22 at 12:10 PM EST by a video-enabled telemedicine application and verified that I am speaking with the correct person using two identifiers.  Location: Patient: Virtual Visit Location Patient:  Home Provider: Virtual Visit Location Provider: Office/Clinic   I discussed the limitations of evaluation and management by telemedicine and the availability of in person appointments. The patient expressed understanding and agreed to proceed.    History of Present Illness: Sheila Schultz is a 47 y.o. who identifies as a female who was assigned female at birth, and is being seen today for vaginal itching.  HPI: Vaginal Itching The patient's primary symptoms include genital itching and vaginal discharge. The patient's pertinent negatives include no genital odor or pelvic pain. This is a new problem. The current episode started yesterday. The problem occurs intermittently. The patient is experiencing no pain. Pertinent negatives include no constipation, diarrhea, dysuria, fever, frequency, headaches or hematuria. The vaginal discharge was thick and white.    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   Syncope 08/03/2021   Palpitations 08/03/2021   GAD (generalized anxiety disorder) 08/15/2016   Obesity (BMI 30-39.9) 08/15/2016    Allergies:  Allergies  Allergen Reactions   Amoxicillin-Pot Clavulanate Diarrhea   Hydrocodone Nausea And Vomiting   Morphine And Related Nausea Only   Medications:  Current Outpatient Medications:    fluconazole (DIFLUCAN) 150 MG tablet, Take 1 tablet (150 mg total) by mouth every three (3) days as needed., Disp: 3 tablet, Rfl: 0   albuterol (VENTOLIN HFA) 108 (90 Base) MCG/ACT inhaler, Inhale 2 puffs into the  lungs every 6 (six) hours as needed., Disp: , Rfl:    buPROPion (WELLBUTRIN XL) 150 MG 24 hr tablet, Take 1 tablet (150 mg total) by mouth daily., Disp: 90 tablet, Rfl: 1   busPIRone (BUSPAR) 5 MG tablet, Take 1 tablet (5 mg total) by mouth 2 (two) times daily.,  Disp: 180 tablet, Rfl: 1   clindamycin (CLEOCIN T) 1 % external solution, Apply topically 2 (two) times daily as needed., Disp: , Rfl:    clindamycin (CLEOCIN T) 1 % lotion, Apply topically 2 (two) times daily as needed., Disp: , Rfl:    clonazePAM (KLONOPIN) 1 MG tablet, Take 1 tablet (1 mg total) by mouth 3 (three) times daily., Disp: 90 tablet, Rfl: 0   metoprolol succinate (TOPROL-XL) 25 MG 24 hr tablet, TAKE 1 TABLET DAILY, Disp: 90 tablet, Rfl: 1   omeprazole (PRILOSEC) 40 MG capsule, Take 1 capsule (40 mg total) by mouth daily., Disp: 90 capsule, Rfl: 1  Observations/Objective: Patient is well-developed, well-nourished in no acute distress.  Resting comfortably  at home.  Head is normocephalic, atraumatic.  No labored breathing.  Speech is clear and coherent with logical content.  Patient is alert and oriented at baseline.    Assessment and Plan: 1. Vagina, candidiasis - fluconazole (DIFLUCAN) 150 MG tablet; Take 1 tablet (150 mg total) by mouth every three (3) days as needed.  Dispense: 3 tablet; Refill: 0  Start diflucan  Keep clean and dry  Follow up if symptoms worsen or do not improve   Follow Up Instructions: I discussed the assessment and treatment plan with the patient. The patient was provided an opportunity to ask questions and all were answered. The patient agreed with the plan and demonstrated an understanding of the instructions.  A copy of instructions were sent to the patient via MyChart unless otherwise noted below.    The patient was advised to call back or seek an in-person evaluation if the symptoms worsen or if the condition fails to improve as anticipated.  Time:  I spent 6 minutes with the patient via telehealth technology discussing the above problems/concerns.    Evelina Dun, FNP

## 2022-08-01 NOTE — Patient Instructions (Signed)

## 2022-08-06 ENCOUNTER — Encounter: Payer: Self-pay | Admitting: Family Medicine

## 2022-08-13 ENCOUNTER — Encounter: Payer: Self-pay | Admitting: Family Medicine

## 2022-08-21 ENCOUNTER — Telehealth: Payer: Self-pay | Admitting: Family Medicine

## 2022-08-21 DIAGNOSIS — F411 Generalized anxiety disorder: Secondary | ICD-10-CM

## 2022-08-21 MED ORDER — BUSPIRONE HCL 10 MG PO TABS: 10.0000 mg | ORAL_TABLET | Freq: Two times a day (BID) | ORAL | 3 refills | Status: DC

## 2022-08-21 NOTE — Telephone Encounter (Signed)
Pt aware rx sent.  

## 2022-08-21 NOTE — Telephone Encounter (Signed)
RX sent  

## 2022-08-21 NOTE — Telephone Encounter (Signed)
  Prescription Request  08/21/2022  Is this a "Controlled Substance" medicine? NO  Have you seen your PCP in the last 2 weeks? NO  If YES, route message to pool  -  If NO, patient needs to be scheduled for appointment.  What is the name of the medication or equipment? Buspirone 10 mg. Patient was taking 5 mg and Tiffany increased it to 10 mg and needs RX called in  Have you contacted your pharmacy to request a refill? NO   Which pharmacy would you like this sent to? [facility]   Patient notified that their request is being sent to the clinical staff for review and that they should receive a response within 2 business days.

## 2022-08-26 ENCOUNTER — Ambulatory Visit: Payer: Medicaid Other | Admitting: Family Medicine

## 2022-09-09 ENCOUNTER — Ambulatory Visit: Payer: Medicaid Other | Admitting: Family Medicine

## 2022-09-18 ENCOUNTER — Telehealth: Payer: Self-pay | Admitting: Family Medicine

## 2022-09-18 ENCOUNTER — Ambulatory Visit: Payer: Medicaid Other | Admitting: Family Medicine

## 2022-09-18 NOTE — Telephone Encounter (Signed)
  Prescription Request  09/18/2022  Is this a "Controlled Substance" medicine? No  Have you seen your PCP in the last 2 weeks? No  If YES, route message to pool  -  If NO, patient needs to be scheduled for appointment.  What is the name of the medication or equipment? Metoprolol 25 mg 24 hr tablet. Patient had appt with Tiffany today and will need refill before 4-18.  Have you contacted your pharmacy to request a refill? NO   Which pharmacy would you like this sent to? [facility]   Patient notified that their request is being sent to the clinical staff for review and that they should receive a response within 2 business days.

## 2022-09-18 NOTE — Telephone Encounter (Signed)
Pt had to reschedule today's visit w/ PCP, informed her that a 90-d supply w/ a RF was sent to [facility] on 06/24/22, she will check w/ pharmacy

## 2022-09-20 ENCOUNTER — Telehealth: Payer: Medicaid Other

## 2022-09-20 ENCOUNTER — Telehealth (INDEPENDENT_AMBULATORY_CARE_PROVIDER_SITE_OTHER): Payer: Medicaid Other | Admitting: Family Medicine

## 2022-09-20 NOTE — Progress Notes (Signed)
Attempted to call multiple times and patient did not answer.

## 2022-09-25 ENCOUNTER — Ambulatory Visit: Payer: 59 | Admitting: Family Medicine

## 2022-10-03 ENCOUNTER — Ambulatory Visit: Payer: Medicaid Other | Admitting: Family Medicine

## 2022-10-03 ENCOUNTER — Encounter: Payer: Self-pay | Admitting: Family Medicine

## 2022-10-14 ENCOUNTER — Encounter: Payer: Self-pay | Admitting: Family Medicine

## 2022-10-14 ENCOUNTER — Ambulatory Visit: Payer: Medicaid Other | Admitting: Family Medicine

## 2022-10-14 VITALS — BP 120/63 | HR 95 | Temp 98.3°F | Ht 66.0 in | Wt 130.0 lb

## 2022-10-14 DIAGNOSIS — F1320 Sedative, hypnotic or anxiolytic dependence, uncomplicated: Secondary | ICD-10-CM

## 2022-10-14 DIAGNOSIS — F411 Generalized anxiety disorder: Secondary | ICD-10-CM | POA: Diagnosis not present

## 2022-10-14 DIAGNOSIS — F339 Major depressive disorder, recurrent, unspecified: Secondary | ICD-10-CM

## 2022-10-14 DIAGNOSIS — F509 Eating disorder, unspecified: Secondary | ICD-10-CM

## 2022-10-14 MED ORDER — BUSPIRONE HCL 15 MG PO TABS: 15.0000 mg | ORAL_TABLET | Freq: Two times a day (BID) | ORAL | 1 refills | Status: DC

## 2022-10-14 MED ORDER — CLONAZEPAM 1 MG PO TABS: 1.0000 mg | ORAL_TABLET | Freq: Three times a day (TID) | ORAL | 2 refills | Status: AC

## 2022-10-14 NOTE — Progress Notes (Signed)
Established Patient Office Visit  Subjective   Patient ID: Sheila Schultz, female    DOB: 05-18-1976  Age: 47 y.o. MRN: [id number]  Chief Complaint  Patient presents with   Medical Management of Chronic Issues   Insomnia   Anxiety    HPI Sheila Schultz is here for a follow up. She reports that buspar has been helpful in reducing her anxiety. She has been obsessing less about cleaning and has been able to fall asleep easier. She does continue to wake up frequently at night. She continues to have panic attacks and feels down and depressed. She admits that she is concerned that she has now developed an eating disorder. She started a weight loss journey almost a year ago when she was over 200 lb. She is now down to 130 lbs. She admits that she is very particular about what she can and cannot eat and meticulously counts her calories. She reprots that she "goes to pieces" if she gains even a few ounces. She has continued to lose weight despite family and friends telling her that she looks too thin now. She had a panic attack last night on the was to a restaurant while trying to decide what she could eating. She typically has a protein shake for breakfast, quest chips for lunch, meat and a salad without dressing for dinner. She denies purging     10/14/2022    1:40 PM 07/15/2022    9:05 AM 05/03/2022   12:25 PM  Depression screen PHQ 2/9  Decreased Interest 2 1 1   Down, Depressed, Hopeless 2 2 2   PHQ - 2 Score 4 3 3   Altered sleeping 3 1 3   Tired, decreased energy 1 3 0  Change in appetite 3 3 1   Feeling bad or failure about yourself  3 3 3   Trouble concentrating 3 3 3   Moving slowly or fidgety/restless 3 3 0  Suicidal thoughts 0 0 0  PHQ-9 Score 20 19 13   Difficult doing work/chores Extremely dIfficult Very difficult Not difficult at all      10/14/2022    1:41 PM 07/15/2022    9:05 AM 05/03/2022   12:27 PM 03/18/2022   12:00 PM  GAD 7 : Generalized Anxiety Score  Nervous, Anxious, on Edge 3  3 3 1   Control/stop worrying 3 3 3 3   Worry too much - different things 3 3 3 3   Trouble relaxing 3 3 3 3   Restless 3 3 3 3   Easily annoyed or irritable 1 1 3 3   Afraid - awful might happen 2 1 0 1  Total GAD 7 Score 18 17 18 17   Anxiety Difficulty Not difficult at all Very difficult Very difficult        ROS As per HPI.    Objective:     BP 120/63   Pulse 95   Temp 98.3 F (36.8 C) (Temporal)   Ht 5\' 6"  (1.676 m)   Wt 130 lb (59 kg)   SpO2 96%   BMI 20.98 kg/m  Wt Readings from Last 3 Encounters:  10/14/22 130 lb (59 kg)  07/15/22 142 lb 8 oz (64.6 kg)  05/03/22 158 lb (71.7 kg)      Physical Exam Vitals and nursing note reviewed.  Constitutional:      General: She is not in acute distress.    Appearance: Normal appearance. She is not ill-appearing, toxic-appearing or diaphoretic.  HENT:     Head: Normocephalic and atraumatic.  Nose: Nose normal.     Mouth/Throat:     Mouth: Mucous membranes are moist.     Pharynx: Oropharynx is clear.  Eyes:     General: No scleral icterus.    Extraocular Movements: Extraocular movements intact.     Pupils: Pupils are equal, round, and reactive to light.  Neck:     Thyroid: No thyroid mass, thyromegaly or thyroid tenderness.  Cardiovascular:     Rate and Rhythm: Regular rhythm.     Heart sounds: Normal heart sounds. No murmur heard. Pulmonary:     Effort: Pulmonary effort is normal. No respiratory distress.     Breath sounds: Normal breath sounds.  Musculoskeletal:     Cervical back: Neck supple. No rigidity.     Right lower leg: No edema.     Left lower leg: No edema.  Skin:    General: Skin is warm and dry.  Neurological:     General: No focal deficit present.     Mental Status: She is alert and oriented to person, place, and time.  Psychiatric:        Attention and Perception: Attention normal.        Mood and Affect: Affect is tearful.        Behavior: Behavior normal.        Thought Content: Thought  content does not include homicidal or suicidal ideation. Thought content does not include homicidal or suicidal plan.        Cognition and Memory: Cognition normal.      No results found for any visits on 10/14/22.    The ASCVD Risk score (Arnett DK, et al., 2019) failed to calculate for the following reasons:   Cannot find a previous HDL lab   Cannot find a previous total cholesterol lab    Assessment & Plan:   Sonjia was seen today for medical management of chronic issues, insomnia and anxiety.  Diagnoses and all orders for this visit:  GAD (generalized anxiety disorder) Benzodiazepine dependence ([facility]) Depression, recurrent ([facility]) Uncontrolled. Denies SI. She has tried and failed numerous SSRIs in the past. Increase buspar to 15 mg BID. CSA signed today. UDS is UTD. PDMP reviewed no red flags. Refills provided.  -     busPIRone (BUSPAR) 15 MG tablet; Take 1 tablet (15 mg total) by mouth 2 (two) times daily. -     Ambulatory referral to Psychiatry -     Ambulatory referral to Psychology -     clonazePAM (KLONOPIN) 1 MG tablet; Take 1 tablet (1 mg total) by mouth 3 (three) times daily.  Eating disorder, unspecified type Denies purging. Restricting intake despite BMI of 20. Urgent referral placed as below. Will check labs as below today.  -     Anemia Profile B -     CMP14+EGFR -     TSH -     Ambulatory referral to Psychiatry -     Ambulatory referral to Psychology  Return in about 6 weeks (around 11/25/2022) for medication follow up.  The patient indicates understanding of these issues and agrees with the plan.   Gabriel Earing, FNP

## 2022-10-15 LAB — ANEMIA PROFILE B
Basophils Absolute: 0.1 10*3/uL (ref 0.0–0.2)
Basos: 1 %
EOS (ABSOLUTE): 0.5 10*3/uL — ABNORMAL HIGH (ref 0.0–0.4)
Eos: 4 %
Ferritin: 135 ng/mL (ref 15–150)
Folate: 4.4 ng/mL (ref >3.0)
Hematocrit: 46.2 % (ref 34.0–46.6)
Hemoglobin: 15.4 g/dL (ref 11.1–15.9)
Immature Grans (Abs): 0 10*3/uL (ref 0.0–0.1)
Immature Granulocytes: 0 %
Iron Saturation: 43 % (ref 15–55)
Iron: 123 ug/dL (ref 27–159)
Lymphocytes Absolute: 3.7 10*3/uL — ABNORMAL HIGH (ref 0.7–3.1)
Lymphs: 32 %
MCH: 32.2 pg (ref 26.6–33.0)
MCHC: 33.3 g/dL (ref 31.5–35.7)
MCV: 97 fL (ref 79–97)
Monocytes Absolute: 0.7 10*3/uL (ref 0.1–0.9)
Monocytes: 6 %
Neutrophils Absolute: 6.6 10*3/uL (ref 1.4–7.0)
Neutrophils: 57 %
Platelets: 292 10*3/uL (ref 150–450)
RBC: 4.78 x10E6/uL (ref 3.77–5.28)
RDW: 11.8 % (ref 11.7–15.4)
Retic Ct Pct: 1.3 % (ref 0.6–2.6)
Total Iron Binding Capacity: 283 ug/dL (ref 250–450)
UIBC: 160 ug/dL (ref 131–425)
Vitamin B-12: 448 pg/mL (ref 232–1245)
WBC: 11.6 10*3/uL — ABNORMAL HIGH (ref 3.4–10.8)

## 2022-10-15 LAB — CMP14+EGFR
ALT: 13 IU/L (ref 0–32)
AST: 12 IU/L (ref 0–40)
Albumin/Globulin Ratio: 2 (ref 1.2–2.2)
Albumin: 4.3 g/dL (ref 3.9–4.9)
Alkaline Phosphatase: 64 IU/L (ref 44–121)
BUN/Creatinine Ratio: 34 — ABNORMAL HIGH (ref 9–23)
BUN: 24 mg/dL (ref 6–24)
Bilirubin Total: 0.3 mg/dL (ref 0.0–1.2)
CO2: 20 mmol/L (ref 20–29)
Calcium: 9.8 mg/dL (ref 8.7–10.2)
Chloride: 102 mmol/L (ref 96–106)
Creatinine, Ser: 0.7 mg/dL (ref 0.57–1.00)
Globulin, Total: 2.2 g/dL (ref 1.5–4.5)
Glucose: 92 mg/dL (ref 70–99)
Potassium: 5.2 mmol/L (ref 3.5–5.2)
Sodium: 138 mmol/L (ref 134–144)
Total Protein: 6.5 g/dL (ref 6.0–8.5)
eGFR: 108 mL/min/{1.73_m2} (ref >59)

## 2022-10-15 LAB — TSH: TSH: 1.73 u[IU]/mL (ref 0.450–4.500)

## 2022-10-16 ENCOUNTER — Telehealth: Payer: Self-pay | Admitting: Family Medicine

## 2022-10-16 ENCOUNTER — Encounter: Payer: Self-pay | Admitting: Family Medicine

## 2022-10-16 NOTE — Telephone Encounter (Signed)
Pt says she saw her lab results via Mychart but says no one has called her about them and she saw that she has some high levels that she is concerned about.  Explained to pt that provider has not had a chance to review her results yet which is why no one has called but that I would send a message to see if they can go ahead and be reviewed and someone will call her after they have been reviewed. Pt voiced understanding.  Says if provider thinks there are no concerns then she can just send her a mychart message saying results are ok.

## 2022-10-30 ENCOUNTER — Ambulatory Visit: Payer: 59 | Admitting: Family Medicine

## 2022-10-31 DIAGNOSIS — W5581XA Bitten by other mammals, initial encounter: Secondary | ICD-10-CM | POA: Diagnosis not present

## 2022-10-31 DIAGNOSIS — R000 Tachycardia, unspecified: Secondary | ICD-10-CM | POA: Diagnosis not present

## 2022-10-31 DIAGNOSIS — W5911XA Bitten by nonvenomous snake, initial encounter: Secondary | ICD-10-CM | POA: Diagnosis not present

## 2022-10-31 DIAGNOSIS — S81851A Open bite, right lower leg, initial encounter: Secondary | ICD-10-CM | POA: Diagnosis not present

## 2022-11-05 ENCOUNTER — Ambulatory Visit: Payer: Medicaid Other | Admitting: Family Medicine

## 2022-11-12 ENCOUNTER — Encounter: Payer: Self-pay | Admitting: Family Medicine

## 2022-11-13 ENCOUNTER — Telehealth: Payer: Self-pay | Admitting: Family Medicine

## 2022-11-13 ENCOUNTER — Other Ambulatory Visit: Payer: Self-pay | Admitting: Family Medicine

## 2022-11-13 DIAGNOSIS — F411 Generalized anxiety disorder: Secondary | ICD-10-CM

## 2022-11-13 MED ORDER — BUSPIRONE HCL 10 MG PO TABS
20.0000 mg | ORAL_TABLET | Freq: Two times a day (BID) | ORAL | 0 refills | Status: DC
Start: 2022-11-13 — End: 2023-02-24

## 2022-11-13 NOTE — Telephone Encounter (Signed)
TC back to [facility] w/ [facility] Confirmed increase to 20 mg twice a day on Buspirone

## 2022-11-13 NOTE — Telephone Encounter (Signed)
Stew asking to talk to [facility] about busPIRone (BUSPAR) 10 MG tablet. Please call back

## 2022-11-14 ENCOUNTER — Encounter: Payer: Self-pay | Admitting: Family Medicine

## 2022-11-14 ENCOUNTER — Ambulatory Visit: Payer: Medicaid Other | Admitting: Family Medicine

## 2022-11-28 ENCOUNTER — Encounter: Payer: Self-pay | Admitting: Family Medicine

## 2022-11-29 ENCOUNTER — Other Ambulatory Visit: Payer: Self-pay | Admitting: Family Medicine

## 2022-11-29 DIAGNOSIS — F411 Generalized anxiety disorder: Secondary | ICD-10-CM

## 2022-11-29 MED ORDER — HYDROXYZINE HCL 10 MG PO TABS
10.0000 mg | ORAL_TABLET | Freq: Three times a day (TID) | ORAL | 3 refills | Status: AC | PRN
Start: 2022-11-29 — End: ?

## 2022-12-16 ENCOUNTER — Encounter: Payer: Self-pay | Admitting: Family Medicine

## 2022-12-21 ENCOUNTER — Other Ambulatory Visit: Payer: Self-pay | Admitting: Family Medicine

## 2022-12-23 ENCOUNTER — Telehealth: Payer: Medicaid Other | Admitting: Physician Assistant

## 2022-12-23 DIAGNOSIS — J020 Streptococcal pharyngitis: Secondary | ICD-10-CM | POA: Diagnosis not present

## 2022-12-23 MED ORDER — AZITHROMYCIN 250 MG PO TABS
ORAL_TABLET | ORAL | 0 refills | Status: AC
Start: 2022-12-23 — End: 2022-12-28

## 2022-12-23 NOTE — Progress Notes (Signed)

## 2022-12-23 NOTE — Addendum Note (Signed)
Addended by: Margaretann Loveless on: 12/23/2022 04:55 PM   Modules accepted: Level of Service

## 2022-12-26 ENCOUNTER — Encounter: Payer: Self-pay | Admitting: Family Medicine

## 2023-01-07 DIAGNOSIS — K219 Gastro-esophageal reflux disease without esophagitis: Secondary | ICD-10-CM | POA: Diagnosis not present

## 2023-01-07 DIAGNOSIS — G4700 Insomnia, unspecified: Secondary | ICD-10-CM | POA: Diagnosis not present

## 2023-01-07 DIAGNOSIS — R000 Tachycardia, unspecified: Secondary | ICD-10-CM | POA: Diagnosis not present

## 2023-01-07 DIAGNOSIS — F418 Other specified anxiety disorders: Secondary | ICD-10-CM | POA: Diagnosis not present

## 2023-01-10 ENCOUNTER — Ambulatory Visit: Payer: Medicaid Other | Admitting: Family Medicine

## 2023-01-13 ENCOUNTER — Encounter: Payer: Self-pay | Admitting: Family Medicine

## 2023-01-20 ENCOUNTER — Other Ambulatory Visit: Payer: Self-pay | Admitting: Family Medicine

## 2023-01-20 DIAGNOSIS — F411 Generalized anxiety disorder: Secondary | ICD-10-CM

## 2023-01-20 DIAGNOSIS — F1320 Sedative, hypnotic or anxiolytic dependence, uncomplicated: Secondary | ICD-10-CM

## 2023-01-21 ENCOUNTER — Encounter (HOSPITAL_COMMUNITY): Payer: Self-pay | Admitting: Emergency Medicine

## 2023-01-21 ENCOUNTER — Emergency Department (HOSPITAL_COMMUNITY)
Admission: EM | Admit: 2023-01-21 | Discharge: 2023-01-21 | Disposition: A | Discharge status: Home / Self Care | Payer: Medicaid Other | Attending: Emergency Medicine | Admitting: Emergency Medicine

## 2023-01-21 ENCOUNTER — Other Ambulatory Visit: Payer: Self-pay

## 2023-01-21 ENCOUNTER — Emergency Department (HOSPITAL_COMMUNITY): Payer: Medicaid Other

## 2023-01-21 DIAGNOSIS — R1032 Left lower quadrant pain: Secondary | ICD-10-CM | POA: Insufficient documentation

## 2023-01-21 DIAGNOSIS — F17210 Nicotine dependence, cigarettes, uncomplicated: Secondary | ICD-10-CM | POA: Insufficient documentation

## 2023-01-21 DIAGNOSIS — R109 Unspecified abdominal pain: Secondary | ICD-10-CM

## 2023-01-21 DIAGNOSIS — N200 Calculus of kidney: Secondary | ICD-10-CM | POA: Diagnosis not present

## 2023-01-21 DIAGNOSIS — N132 Hydronephrosis with renal and ureteral calculous obstruction: Secondary | ICD-10-CM | POA: Diagnosis not present

## 2023-01-21 LAB — CBC
HCT: 43.2 % (ref 36.0–46.0)
Hemoglobin: 14.3 g/dL (ref 12.0–15.0)
MCH: 32.6 pg (ref 26.0–34.0)
MCHC: 33.1 g/dL (ref 30.0–36.0)
MCV: 98.4 fL (ref 80.0–100.0)
Platelets: 282 10*3/uL (ref 150–400)
RBC: 4.39 MIL/uL (ref 3.87–5.11)
RDW: 12.8 % (ref 11.5–15.5)
WBC: 12.7 10*3/uL — ABNORMAL HIGH (ref 4.0–10.5)
nRBC: 0 % (ref 0.0–0.2)

## 2023-01-21 LAB — URINALYSIS, ROUTINE W REFLEX MICROSCOPIC
Bilirubin Urine: NEGATIVE
Glucose, UA: NEGATIVE mg/dL
Hgb urine dipstick: NEGATIVE
Ketones, ur: NEGATIVE mg/dL
Leukocytes,Ua: NEGATIVE
Nitrite: NEGATIVE
Protein, ur: NEGATIVE mg/dL
Specific Gravity, Urine: 1.025 (ref 1.005–1.030)
pH: 5 (ref 5.0–8.0)

## 2023-01-21 LAB — COMPREHENSIVE METABOLIC PANEL
ALT: 22 U/L (ref 0–44)
AST: 14 U/L — ABNORMAL LOW (ref 15–41)
Albumin: 3.9 g/dL (ref 3.5–5.0)
Alkaline Phosphatase: 54 U/L (ref 38–126)
Anion gap: 8 (ref 5–15)
BUN: 31 mg/dL — ABNORMAL HIGH (ref 6–20)
CO2: 25 mmol/L (ref 22–32)
Calcium: 9 mg/dL (ref 8.9–10.3)
Chloride: 104 mmol/L (ref 98–111)
Creatinine, Ser: 0.92 mg/dL (ref 0.44–1.00)
GFR, Estimated: >60 mL/min (ref >60)
Glucose, Bld: 112 mg/dL — ABNORMAL HIGH (ref 70–99)
Potassium: 3.9 mmol/L (ref 3.5–5.1)
Sodium: 137 mmol/L (ref 135–145)
Total Bilirubin: 0.5 mg/dL (ref 0.3–1.2)
Total Protein: 7.1 g/dL (ref 6.5–8.1)

## 2023-01-21 LAB — PREGNANCY, URINE: Preg Test, Ur: NEGATIVE

## 2023-01-21 MED ORDER — NAPROXEN 375 MG PO TABS: 375.0000 mg | ORAL_TABLET | Freq: Two times a day (BID) | ORAL | 0 refills | Status: AC

## 2023-01-21 MED ORDER — ONDANSETRON 4 MG PO TBDP: 4.0000 mg | ORAL_TABLET | Freq: Three times a day (TID) | ORAL | 0 refills | Status: AC | PRN

## 2023-01-21 MED ORDER — KETOROLAC TROMETHAMINE 15 MG/ML IJ SOLN: 15.0000 mg | Freq: Once | INTRAMUSCULAR | Status: DC

## 2023-01-21 MED ORDER — ONDANSETRON HCL 4 MG/2ML IJ SOLN
4.0000 mg | Freq: Once | INTRAMUSCULAR | Status: AC
  Administered 2023-01-21: 4 mg via INTRAVENOUS
  Filled 2023-01-21: qty 2

## 2023-01-21 MED ORDER — KETOROLAC TROMETHAMINE 15 MG/ML IJ SOLN
30.0000 mg | Freq: Once | INTRAMUSCULAR | Status: AC
  Administered 2023-01-21: 30 mg via INTRAVENOUS
  Filled 2023-01-21: qty 2

## 2023-01-21 MED ORDER — SODIUM CHLORIDE 0.9 % IV BOLUS
1000.0000 mL | Freq: Once | INTRAVENOUS | Status: AC
  Administered 2023-01-21: 1000 mL via INTRAVENOUS

## 2023-01-21 MED ORDER — TAMSULOSIN HCL 0.4 MG PO CAPS
0.4000 mg | ORAL_CAPSULE | Freq: Once | ORAL | Status: AC
  Administered 2023-01-21: 0.4 mg via ORAL
  Filled 2023-01-21: qty 1

## 2023-01-21 MED ORDER — TAMSULOSIN HCL 0.4 MG PO CAPS: 0.4000 mg | ORAL_CAPSULE | Freq: Every day | ORAL | 0 refills | Status: AC

## 2023-01-21 NOTE — ED Provider Notes (Signed)
[facility]-EMERGENCY DEPT [facility] Emergency Department Provider Note MRN:  [id number]  Arrival date & time: 01/21/23     Chief Complaint   Flank Pain   History of Present Illness   Sheila Schultz is a 47 y.o. year-old female with no pertinent PMH presenting to the ED with chief complaint of flank pain.  Left flank and LLQ pain starting suddenly this morning at 430am.  Review of Systems  A thorough review of systems was obtained and all systems are negative except as noted in the HPI and PMH.   Patient's Health History    Past Medical History:  Diagnosis Date   Anemia    Anxiety    Foot pain, left    Gall stones    Palpitations    Renal disorder     Past Surgical History:  Procedure Laterality Date   ABLATION     TONSILLECTOMY     TUBAL LIGATION      Family History  Problem Relation Age of Onset   Diabetes Mother    Heart disease Mother    Hyperlipidemia Mother     Social History   Socioeconomic History   Marital status: Married    Spouse name: Not on file   Number of children: Not on file   Years of education: Not on file   Highest education level: Not on file  Occupational History   Not on file  Tobacco Use   Smoking status: Some Days    Current packs/day: 0.25    Types: Cigarettes   Smokeless tobacco: Never  Substance and Sexual Activity   Alcohol use: No   Drug use: No   Sexual activity: Yes    Birth control/protection: Surgical  Other Topics Concern   Not on file  Social History Narrative   Not on file   Social Determinants of Health   Financial Resource Strain: Not on file  Food Insecurity: Not on file  Transportation Needs: Not on file  Physical Activity: Not on file  Stress: Not on file  Social Connections: Not on file  Intimate Partner Violence: Not on file     Physical Exam   Vitals:   01/21/23 0645 01/21/23 0700  BP:    Pulse: 74 68  Resp:    Temp:    SpO2: 99% 100%    CONSTITUTIONAL:  well-appearing,  NAD NEURO/PSYCH:  Alert and oriented x 3, no focal deficits EYES:  eyes equal and reactive ENT/NECK:  no LAD, no JVD CARDIO:  regular rate, well-perfused, normal S1 and S2 PULM:  CTAB no wheezing or rhonchi GI/GU:  non-distended, non-tender MSK/SPINE:  No gross deformities, no edema SKIN:  no rash, atraumatic   *Additional and/or pertinent findings included in MDM below  Diagnostic and Interventional Summary    EKG Interpretation Date/Time:    Ventricular Rate:    PR Interval:    QRS Duration:    QT Interval:    QTC Calculation:   R Axis:      Text Interpretation:         Labs Reviewed  CBC - Abnormal; Notable for the following components:      Result Value   WBC 12.7 (*)    All other components within normal limits  COMPREHENSIVE METABOLIC PANEL - Abnormal; Notable for the following components:   Glucose, Bld 112 (*)    BUN 31 (*)    AST 14 (*)    All other components within normal limits  URINALYSIS, ROUTINE W REFLEX MICROSCOPIC  PREGNANCY, URINE    CT RENAL STONE STUDY  Final Result      Medications  sodium chloride 0.9 % bolus 1,000 mL (1,000 mLs Intravenous New Bag/Given 01/21/23 0606)  ketorolac (TORADOL) 15 MG/ML injection 30 mg (30 mg Intravenous Given 01/21/23 0605)  ondansetron (ZOFRAN) injection 4 mg (4 mg Intravenous Given 01/21/23 0612)     Procedures  /  Critical Care Procedures  ED Course and Medical Decision Making  Initial Impression and Ddx Suspect kidney stone.  Other considerations include ovarian cyst, diverticulitis.  Past medical/surgical history that increases complexity of ED encounter:  none  Interpretation of Diagnostics I personally reviewed the laboratory assessment and my interpretation is as follows: No significant blood count or electrolyte disturbance  CT confirms hydronephrosis and kidney stone  Patient Reassessment and Ultimate Disposition/Management Clinical Course as of 01/21/23 0708  Tue Jan 21, 2023  0702 CO2: 25  [MK]    Clinical Course User Index [MK] Kommor, [place], MD     Plan is for reassessment of pain, hopeful for discharge.  Signed out to oncoming provider.  Patient management required discussion with the following services or consulting groups:  None  Complexity of Problems Addressed Acute illness or injury that poses threat of life of bodily function  Additional Data Reviewed and Analyzed Further history obtained from: Further history from spouse/family member  Additional Factors Impacting ED Encounter Risk None  Elmer Sow. Pilar Plate, MD [facility] Emergency Medicine [facility] [email or web address]  Final Clinical Impressions(s) / ED Diagnoses     ICD-10-CM   1. Flank pain  R10.9       ED Discharge Orders     None        Discharge Instructions Discussed with and Provided to Patient:   Discharge Instructions   None      Sabas Sous, MD 01/21/23 [phone]

## 2023-01-21 NOTE — ED Triage Notes (Signed)
Pt c/o left sided flank pain that radiates into the LLQ that woke her up from her sleep around 4:30 am. No NVD. No urinary s/s,

## 2023-01-21 NOTE — ED Provider Notes (Signed)
  Physical Exam  BP 109/60   Pulse 68   Temp 97.6 F (36.4 C) (Oral)   Resp 19   SpO2 100%   Physical Exam Vitals and nursing note reviewed.  Constitutional:      General: She is not in acute distress.    Appearance: She is well-developed.  HENT:     Head: Normocephalic and atraumatic.  Eyes:     Conjunctiva/sclera: Conjunctivae normal.  Cardiovascular:     Rate and Rhythm: Normal rate and regular rhythm.     Heart sounds: No murmur heard. Pulmonary:     Effort: Pulmonary effort is normal. No respiratory distress.     Breath sounds: Normal breath sounds.  Abdominal:     Palpations: Abdomen is soft.     Tenderness: There is no abdominal tenderness.  Musculoskeletal:        General: No swelling.     Cervical back: Neck supple.  Skin:    General: Skin is warm and dry.     Capillary Refill: Capillary refill takes less than 2 seconds.  Neurological:     Mental Status: She is alert.  Psychiatric:        Mood and Affect: Mood normal.     Procedures  Procedures  ED Course / MDM   Clinical Course as of 01/21/23 0718  Tue Jan 21, 2023  0702 CO2: 25 [MK]    Clinical Course User Index [MK] Glendora Score, MD   Medical Decision Making Amount and/or Complexity of Data Reviewed Labs: ordered. Decision-making details documented in ED Course. Radiology: ordered.  Risk Prescription drug management.   Patient received an handoff.  Left flank pain pending CT stone study.  Stone study confirms 3 mm UVJ stone.  No urinary infection or AKI.  Pain controlled with Toradol.  She will be given Flomax and discharged on NSAID therapy, Flomax and Zofran.  Encouraged aggressive hydration at home.  At this time does not meet inpatient criteria for admission and safer discharge with outpatient follow-up.  Return precautions given to which she voiced understanding.       Glendora Score, MD 01/21/23 [phone]

## 2023-01-21 NOTE — ED Notes (Signed)
Patient transported to CT 

## 2023-02-21 ENCOUNTER — Other Ambulatory Visit: Payer: Self-pay | Admitting: Family Medicine

## 2023-02-21 DIAGNOSIS — F411 Generalized anxiety disorder: Secondary | ICD-10-CM

## 2023-03-20 DIAGNOSIS — R509 Fever, unspecified: Secondary | ICD-10-CM | POA: Diagnosis not present

## 2023-03-20 DIAGNOSIS — J029 Acute pharyngitis, unspecified: Secondary | ICD-10-CM | POA: Diagnosis not present

## 2023-04-18 DIAGNOSIS — F4322 Adjustment disorder with anxiety: Secondary | ICD-10-CM | POA: Diagnosis not present

## 2023-06-02 ENCOUNTER — Ambulatory Visit: Payer: Medicaid Other | Admitting: Internal Medicine
